# Patient Record
Sex: Male | Born: 1960 | Race: White | Hispanic: No | Marital: Married | Smoking: Current every day smoker
Health system: Southern US, Community
[De-identification: ages and names within clinical notes are randomized; demographics above are authoritative.]

## PROBLEM LIST (undated history)

## (undated) DIAGNOSIS — N200 Calculus of kidney: Secondary | ICD-10-CM

## (undated) DIAGNOSIS — M5430 Sciatica, unspecified side: Secondary | ICD-10-CM

## (undated) DIAGNOSIS — I10 Essential (primary) hypertension: Secondary | ICD-10-CM

## (undated) DIAGNOSIS — C801 Malignant (primary) neoplasm, unspecified: Secondary | ICD-10-CM

## (undated) DIAGNOSIS — E7800 Pure hypercholesterolemia, unspecified: Secondary | ICD-10-CM

## (undated) DIAGNOSIS — M549 Dorsalgia, unspecified: Secondary | ICD-10-CM

## (undated) HISTORY — PX: SKIN CANCER EXCISION: SHX779

---

## 2004-04-07 ENCOUNTER — Emergency Department (HOSPITAL_COMMUNITY): Admission: EM | Admit: 2004-04-07 | Discharge: 2004-04-07 | Payer: Self-pay | Admitting: *Deleted

## 2004-04-18 ENCOUNTER — Encounter (HOSPITAL_COMMUNITY): Admission: RE | Admit: 2004-04-18 | Discharge: 2004-05-18 | Payer: Self-pay | Admitting: Family Medicine

## 2004-05-15 ENCOUNTER — Ambulatory Visit (HOSPITAL_COMMUNITY): Admission: RE | Admit: 2004-05-15 | Discharge: 2004-05-15 | Payer: Self-pay | Admitting: Orthopaedic Surgery

## 2006-01-03 ENCOUNTER — Emergency Department (HOSPITAL_COMMUNITY): Admission: EM | Admit: 2006-01-03 | Discharge: 2006-01-03 | Payer: Self-pay | Admitting: Emergency Medicine

## 2007-01-11 ENCOUNTER — Emergency Department (HOSPITAL_COMMUNITY): Admission: EM | Admit: 2007-01-11 | Discharge: 2007-01-11 | Payer: Self-pay | Admitting: Emergency Medicine

## 2008-04-02 ENCOUNTER — Emergency Department (HOSPITAL_COMMUNITY): Admission: EM | Admit: 2008-04-02 | Discharge: 2008-04-02 | Payer: Self-pay | Admitting: Emergency Medicine

## 2008-04-04 ENCOUNTER — Emergency Department (HOSPITAL_COMMUNITY): Admission: EM | Admit: 2008-04-04 | Discharge: 2008-04-04 | Payer: Self-pay | Admitting: Emergency Medicine

## 2008-04-06 ENCOUNTER — Emergency Department (HOSPITAL_COMMUNITY): Admission: EM | Admit: 2008-04-06 | Discharge: 2008-04-06 | Payer: Self-pay | Admitting: Emergency Medicine

## 2008-04-08 ENCOUNTER — Emergency Department (HOSPITAL_COMMUNITY): Admission: EM | Admit: 2008-04-08 | Discharge: 2008-04-08 | Payer: Self-pay | Admitting: Emergency Medicine

## 2011-04-19 ENCOUNTER — Emergency Department (HOSPITAL_COMMUNITY)
Admission: EM | Admit: 2011-04-19 | Discharge: 2011-04-19 | Disposition: A | Discharge status: Home / Self Care | Payer: 59 | Attending: Emergency Medicine | Admitting: Emergency Medicine

## 2011-04-19 DIAGNOSIS — L0231 Cutaneous abscess of buttock: Secondary | ICD-10-CM | POA: Insufficient documentation

## 2011-04-21 ENCOUNTER — Emergency Department (HOSPITAL_COMMUNITY)
Admission: EM | Admit: 2011-04-21 | Discharge: 2011-04-21 | Disposition: A | Discharge status: Home / Self Care | Payer: 59 | Attending: Emergency Medicine | Admitting: Emergency Medicine

## 2011-04-21 DIAGNOSIS — L03317 Cellulitis of buttock: Secondary | ICD-10-CM | POA: Insufficient documentation

## 2011-04-21 DIAGNOSIS — L0231 Cutaneous abscess of buttock: Secondary | ICD-10-CM | POA: Insufficient documentation

## 2011-09-02 ENCOUNTER — Emergency Department (HOSPITAL_COMMUNITY)
Admission: EM | Admit: 2011-09-02 | Discharge: 2011-09-02 | Disposition: A | Discharge status: Home / Self Care | Payer: Self-pay | Attending: Emergency Medicine | Admitting: Emergency Medicine

## 2011-09-02 ENCOUNTER — Encounter: Payer: Self-pay | Admitting: Emergency Medicine

## 2011-09-02 DIAGNOSIS — F17200 Nicotine dependence, unspecified, uncomplicated: Secondary | ICD-10-CM | POA: Insufficient documentation

## 2011-09-02 DIAGNOSIS — L0231 Cutaneous abscess of buttock: Secondary | ICD-10-CM

## 2011-09-02 MED ORDER — LIDOCAINE HCL (PF) 1 % IJ SOLN
5.0000 mL | Freq: Once | INTRAMUSCULAR | Status: AC
  Administered 2011-09-02: 5 mL
  Filled 2011-09-02: qty 5

## 2011-09-02 MED ORDER — OXYCODONE-ACETAMINOPHEN 5-325 MG PO TABS
1.0000 | ORAL_TABLET | Freq: Once | ORAL | Status: AC
  Administered 2011-09-02: 1 via ORAL
  Filled 2011-09-02: qty 1

## 2011-09-02 MED ORDER — HYDROCODONE-ACETAMINOPHEN 5-325 MG PO TABS: ORAL_TABLET | ORAL | Status: AC

## 2011-09-02 MED ORDER — DOXYCYCLINE HYCLATE 100 MG PO TABS
100.0000 mg | ORAL_TABLET | Freq: Once | ORAL | Status: AC
  Administered 2011-09-02: 100 mg via ORAL
  Filled 2011-09-02: qty 1

## 2011-09-02 MED ORDER — DOXYCYCLINE HYCLATE 100 MG PO CAPS: 100.0000 mg | ORAL_CAPSULE | Freq: Two times a day (BID) | ORAL | Status: AC

## 2011-09-02 NOTE — ED Provider Notes (Signed)
History     CSN: [id number] Arrival date & time: 09/02/2011  6:24 PM   Chief Complaint  Patient presents with  . Abscess     (Include location/radiation/quality/duration/timing/severity/associated sxs/prior treatment) Patient is a 50 y.o. male presenting with abscess. The history is provided by the patient.  Abscess  This is a new problem. The current episode started yesterday. The onset was gradual. The problem occurs continuously. The problem has been gradually worsening. The abscess is present on the left buttock. The problem is moderate. The abscess is characterized by painfulness and swelling. Pertinent negatives include no fever and no vomiting. His past medical history is significant for skin abscesses in family. There were no sick contacts. He has received no recent medical care.     History reviewed. No pertinent past medical history.   History reviewed. No pertinent past surgical history.  History reviewed. No pertinent family history.  History  Substance Use Topics  . Smoking status: Current Everyday Smoker    Types: Cigarettes  . Smokeless tobacco: Not on file  . Alcohol Use: No      Review of Systems  Constitutional: Negative for fever.  Gastrointestinal: Negative for vomiting.  Genitourinary: Negative for dysuria, hematuria and decreased urine volume.  Musculoskeletal: Negative for back pain and arthralgias.  Skin:       abscess  Neurological: Negative for weakness and numbness.  Hematological: Negative for adenopathy. Does not bruise/bleed easily.  All other systems reviewed and are negative.    Allergies  Bee venom  Home Medications  No current outpatient prescriptions on file.  Physical Exam    BP 150/106  Pulse 110  Temp(Src) 98 F (36.7 C) (Oral)  Resp 18  Ht 5\' 4"  (1.626 m)  Wt 154 lb 9.6 oz (70.126 kg)  BMI 26.54 kg/m2  SpO2 98%  Physical Exam  Nursing note and vitals reviewed. Constitutional: He appears well-developed and  well-nourished. No distress.  HENT:  Head: Normocephalic and atraumatic.  Mouth/Throat: Oropharynx is clear and moist.  Neck: Normal range of motion. Neck supple.  Cardiovascular: Normal rate, regular rhythm and normal heart sounds.   Pulmonary/Chest: Effort normal and breath sounds normal.  Musculoskeletal: He exhibits no edema and no tenderness.  Lymphadenopathy:    He has no cervical adenopathy.  Neurological: He is alert. No cranial nerve deficit. Coordination normal.  Skin:          Fluctuant abscess to the upper left buttock.  Moderate induration.  No drainage or surrounding erythema    ED Course  INCISION AND DRAINAGE Date/Time: 09/02/2011 9:15 PM Performed by: Trisha Mangle, Inna Tisdell L. Authorized by: Linwood Dibbles R Consent: Verbal consent obtained. Written consent not obtained. Risks and benefits: risks, benefits and alternatives were discussed Consent given by: patient Patient understanding: patient states understanding of the procedure being performed Patient consent: the patient's understanding of the procedure matches consent given Procedure consent: procedure consent matches procedure scheduled Patient identity confirmed: verbally with patient Time out: Immediately prior to procedure a "time out" was called to verify the correct patient, procedure, equipment, support staff and site/side marked as required. Type: abscess Location: left buttock. Anesthesia: local infiltration Local anesthetic: lidocaine 1% without epinephrine Anesthetic total: 3 ml Patient sedated: no Scalpel size: 11 Needle gauge: 22 Incision type: single straight Complexity: complex Drainage: purulent Drainage amount: copious Wound treatment: wound left open Packing material: 1/4 in iodoform gauze Patient tolerance: Patient tolerated the procedure well with no immediate complications.       MDM  Abscess to left upper buttock.  Pt has hx of same.  Will start doxycycline.  Agrees to return here  in 2 days for recheck and packing removal. No surrounding erythema at this time.         Simi Briel L. [place], [place] 09/02/11 2132

## 2011-09-02 NOTE — ED Notes (Signed)
Pt c/o abscess to the left buttocks x2 days. 

## 2011-09-02 NOTE — ED Notes (Signed)
50 cent piece sized abscess to left upper buttock; states has been there x 1 week; states has not been draining.

## 2011-09-02 NOTE — ED Notes (Signed)
MD at bedside. 

## 2011-09-03 NOTE — ED Provider Notes (Signed)
Medical screening examination/treatment/procedure(s) were performed by non-physician practitioner and as supervising physician I was immediately available for consultation/collaboration.  Celene Kras, MD 09/03/11 1600

## 2011-09-04 ENCOUNTER — Encounter (HOSPITAL_COMMUNITY): Payer: Self-pay | Admitting: *Deleted

## 2011-09-04 ENCOUNTER — Emergency Department (HOSPITAL_COMMUNITY)
Admission: EM | Admit: 2011-09-04 | Discharge: 2011-09-04 | Disposition: A | Discharge status: Home / Self Care | Payer: Self-pay | Attending: Emergency Medicine | Admitting: Emergency Medicine

## 2011-09-04 DIAGNOSIS — L0231 Cutaneous abscess of buttock: Secondary | ICD-10-CM | POA: Insufficient documentation

## 2011-09-04 DIAGNOSIS — Z4801 Encounter for change or removal of surgical wound dressing: Secondary | ICD-10-CM | POA: Insufficient documentation

## 2011-09-04 DIAGNOSIS — L03317 Cellulitis of buttock: Secondary | ICD-10-CM | POA: Insufficient documentation

## 2011-09-04 NOTE — ED Notes (Addendum)
Pt states he is here to have packing removed from a wound on his bottom.

## 2011-09-04 NOTE — ED Provider Notes (Signed)
History     CSN: [id number] Arrival date & time: 09/04/2011  5:29 PM  Chief Complaint  Patient presents with  . Wound Check    HPI  (Consider location/radiation/quality/duration/timing/severity/associated sxs/prior treatment)  Patient is a 50 y.o. male presenting with wound check. The history is provided by the patient.  Wound Check  He was treated in the ED 2 to 3 days ago. Previous treatment in the ED includes I&D of abscess. Treatments since wound repair include antibiotic ointment use. Fever duration: no fever. There has been colored discharge from the wound. The redness has improved. The swelling has improved. The pain has improved. He has no difficulty moving the affected extremity or digit.    History reviewed. No pertinent past medical history.  History reviewed. No pertinent past surgical history.  History reviewed. No pertinent family history.  History  Substance Use Topics  . Smoking status: Current Everyday Smoker    Types: Cigarettes  . Smokeless tobacco: Not on file  . Alcohol Use: No      Review of Systems  Review of Systems  Constitutional: Negative for fever.  Gastrointestinal: Negative for vomiting, abdominal pain and diarrhea.  Musculoskeletal: Negative for back pain and gait problem.  Skin: Positive for wound.  Neurological: Negative for weakness and numbness.  Hematological: Negative for adenopathy. Does not bruise/bleed easily.  All other systems reviewed and are negative.    Allergies  Bee venom  Home Medications   Current Outpatient Rx  Name Route Sig Dispense Refill  . DOXYCYCLINE HYCLATE 100 MG PO CAPS Oral Take 1 capsule (100 mg total) by mouth 2 (two) times daily. 20 capsule 0  . HYDROCODONE-ACETAMINOPHEN 5-325 MG PO TABS  Take one-two tabs po q 4-6 hrs prn pain 24 tablet 0    Physical Exam    BP 136/92  Pulse 87  Temp(Src) 98.4 F (36.9 C) (Oral)  Resp 20  Ht 5\' 4"  (1.626 m)  Wt 154 lb (69.854 kg)  BMI 26.43 kg/m2  SpO2  99%  Physical Exam  Nursing note and vitals reviewed. Constitutional: He is oriented to person, place, and time. He appears well-developed and well-nourished. No distress.  HENT:  Head: Normocephalic and atraumatic.  Mouth/Throat: Oropharynx is clear and moist.  Cardiovascular: Normal rate, regular rhythm and normal heart sounds.   Pulmonary/Chest: Effort normal and breath sounds normal.  Abdominal: Soft. He exhibits no distension. There is no tenderness. There is no rebound.  Musculoskeletal: He exhibits tenderness. He exhibits no edema.  Neurological: He is oriented to person, place, and time. He exhibits normal muscle tone. Coordination normal.  Skin: Skin is warm. No rash noted.       Abscess to left buttock with previous I&D performed.      ED Course  Procedures (including critical care time)       MDM     6:14 PM abscess to left buttock with previous I&D performed by me.  Appears to be improving.  Packing removed from by me without difficulty.  Patient currently taking antibiotics    Ellice Boultinghouse L. [place], [place] 09/13/11 1825

## 2011-09-09 LAB — DIFFERENTIAL
Basophils Absolute: 0
Basophils Relative: 0
Eosinophils Absolute: 0.1
Eosinophils Relative: 1
Lymphocytes Relative: 14
Lymphs Abs: 2
Monocytes Absolute: 0.6
Monocytes Relative: 4
Neutro Abs: 11.1 — ABNORMAL HIGH
Neutrophils Relative %: 81 — ABNORMAL HIGH

## 2011-09-09 LAB — CBC
HCT: 44.3
Hemoglobin: 15.8
MCHC: 35.6
MCV: 90.3
Platelets: 287
RBC: 4.91
RDW: 13.8
WBC: 13.8 — ABNORMAL HIGH

## 2011-09-16 NOTE — ED Provider Notes (Signed)
Medical screening examination/treatment/procedure(s) were performed by non-physician practitioner and as supervising physician I was immediately available for consultation/collaboration.   Shelda Jakes, MD 09/16/11 [phone]

## 2012-01-31 ENCOUNTER — Emergency Department (HOSPITAL_COMMUNITY)
Admission: EM | Admit: 2012-01-31 | Discharge: 2012-01-31 | Disposition: A | Discharge status: Home / Self Care | Payer: Self-pay | Attending: Emergency Medicine | Admitting: Emergency Medicine

## 2012-01-31 ENCOUNTER — Encounter (HOSPITAL_COMMUNITY): Payer: Self-pay | Admitting: *Deleted

## 2012-01-31 DIAGNOSIS — M549 Dorsalgia, unspecified: Secondary | ICD-10-CM | POA: Insufficient documentation

## 2012-01-31 DIAGNOSIS — M5430 Sciatica, unspecified side: Secondary | ICD-10-CM | POA: Insufficient documentation

## 2012-01-31 DIAGNOSIS — F17200 Nicotine dependence, unspecified, uncomplicated: Secondary | ICD-10-CM | POA: Insufficient documentation

## 2012-01-31 MED ORDER — METHOCARBAMOL 500 MG PO TABS: ORAL_TABLET | ORAL | Status: DC

## 2012-01-31 MED ORDER — NAPROXEN 500 MG PO TABS: 500.0000 mg | ORAL_TABLET | Freq: Two times a day (BID) | ORAL | Status: DC

## 2012-01-31 MED ORDER — OXYCODONE-ACETAMINOPHEN 5-325 MG PO TABS: 1.0000 | ORAL_TABLET | ORAL | Status: AC | PRN

## 2012-01-31 MED ORDER — METHOCARBAMOL 500 MG PO TABS
1000.0000 mg | ORAL_TABLET | Freq: Once | ORAL | Status: AC
  Administered 2012-01-31: 1000 mg via ORAL
  Filled 2012-01-31: qty 2

## 2012-01-31 MED ORDER — OXYCODONE-ACETAMINOPHEN 5-325 MG PO TABS
1.0000 | ORAL_TABLET | Freq: Once | ORAL | Status: AC
  Administered 2012-01-31: 1 via ORAL
  Filled 2012-01-31: qty 1

## 2012-01-31 NOTE — ED Notes (Signed)
Pt c/o pain in his lower back x 2 months. Pt states that the pain is radiating down to his right buttock and leg x 1- 2 weeks ago. Pt denies injury.

## 2012-01-31 NOTE — ED Notes (Addendum)
Pt a/ox4. Resp even and unlabored. NAD at this time. D/C instructions reviewed with pt. Pt verbalized understanding. Pt ambulated to lobby with steady gate.  

## 2012-01-31 NOTE — Discharge Instructions (Signed)
Sciatica Sciatica is a name for lower back pain caused by pressure on the sciatic nerve. The back pain can spread to the buttocks and back of the leg. HOME CARE   Rest as much as possible.   Only take medicine as told by your doctor.   Apply cold or heat to your back as told by your doctor.   Do not bend, lift, or sit for a long time until your pain is better.   Do not do anything that makes the condition worse.   Start normal activity when the pain is better.   Keep all follow-up visits.  GET HELP RIGHT AWAY IF:   There is more pain.   There is weakness or numbness in the legs.   You cannot control when you poop (bowel movement) or pee (urinate).  MAKE SURE YOU:   Understand these instructions.   Will watch this condition.   Will get help right away if you are not doing well or get worse.  Document Released: 09/09/2008 Document Revised: 08/13/2011 Document Reviewed: 09/09/2008 [facility] Patient Information 2012 [place], [place].

## 2012-01-31 NOTE — ED Provider Notes (Signed)
History     CSN: [id number]  Arrival date & time 01/31/12  1445   First MD Initiated Contact with Patient 01/31/12 1516      Chief Complaint  Patient presents with  . Back Pain    (Consider location/radiation/quality/duration/timing/severity/associated sxs/prior treatment) HPI Comments: Patient with hx of chronic lower back pain c/o worsening pain for 2 months.  States that recently the pain has began to radiate into his right leg down to his foot.  Pain is worse with certain movements and improves with rest.  Stats he has been taking ibuprofen w/o improvement of his pain.  States the pain is similar to previous lower back pain but the pain into his leg is new.  He denies known injury, dysuria, incontinence of urine or feces, weakness of the extremities or abd pain  Patient is a 51 y.o. male presenting with back pain. The history is provided by the patient. No language interpreter was used.  Back Pain  This is a chronic problem. The current episode started more than 1 week ago. The problem occurs constantly. The problem has been gradually worsening. The pain is associated with twisting. The pain is present in the sacro-iliac joint and lumbar spine. The quality of the pain is described as aching and shooting. The pain radiates to the right thigh and right foot. The pain is moderate. The symptoms are aggravated by bending, twisting and certain positions. The pain is the same all the time. Associated symptoms include leg pain. Pertinent negatives include no fever, no numbness, no abdominal pain, no abdominal swelling, no bowel incontinence, no perianal numbness, no bladder incontinence, no dysuria, no pelvic pain, no paresthesias, no paresis, no tingling and no weakness. He has tried NSAIDs for the symptoms. The treatment provided no relief.    History reviewed. No pertinent past medical history.  History reviewed. No pertinent past surgical history.  History reviewed. No pertinent family  history.  History  Substance Use Topics  . Smoking status: Current Everyday Smoker    Types: Cigarettes  . Smokeless tobacco: Not on file  . Alcohol Use: No      Review of Systems  Constitutional: Negative for fever.  Gastrointestinal: Negative for abdominal pain and bowel incontinence.  Genitourinary: Negative for bladder incontinence, dysuria, decreased urine volume, testicular pain and pelvic pain.  Musculoskeletal: Positive for back pain. Negative for joint swelling.  Skin: Negative.   Neurological: Negative for tingling, weakness, numbness and paresthesias.  All other systems reviewed and are negative.    Allergies  Bee venom  Home Medications   Current Outpatient Rx  Name Route Sig Dispense Refill  . IBUPROFEN 200 MG PO TABS Oral Take 800 mg by mouth every 6 (six) hours as needed. pain      BP 142/92  Pulse 109  Temp(Src) 97.8 F (36.6 C) (Oral)  Resp 20  Ht 5\' 4"  (1.626 m)  Wt 145 lb (65.772 kg)  BMI 24.89 kg/m2  SpO2 98%  Physical Exam  Nursing note and vitals reviewed. Constitutional: He is oriented to person, place, and time. He appears well-developed and well-nourished. No distress.  HENT:  Head: Normocephalic and atraumatic.  Mouth/Throat: Oropharynx is clear and moist.  Neck: Normal range of motion. Neck supple.  Cardiovascular: Normal rate, regular rhythm and normal heart sounds.   Pulmonary/Chest: Effort normal and breath sounds normal. No respiratory distress.  Abdominal: Soft. He exhibits no distension. There is no tenderness.  Musculoskeletal: He exhibits no edema and no tenderness.  Lumbar back: He exhibits tenderness, bony tenderness and pain. He exhibits normal range of motion, no swelling, no edema and normal pulse.  Lymphadenopathy:    He has no cervical adenopathy.  Neurological: He is alert and oriented to person, place, and time. No cranial nerve deficit or sensory deficit. He exhibits normal muscle tone. Coordination and gait  normal.  Reflex Scores:      Patellar reflexes are 2+ on the right side and 2+ on the left side.      Achilles reflexes are 2+ on the right side and 2+ on the left side. Skin: Skin is warm and dry.    ED Course  Procedures (including critical care time)       MDM     ttp of the right SI joint and paraspinal muscles.  No focal neuro deficits, ambulates with a slow but steady gait. pt has appt with his PMD this week.  He agrees to d/c the ibuprofen while taking the naprosyn  Patient / Family / Caregiver understand and agree with initial ED impression and plan with expectations set for ED visit. Pt stable in ED with no significant deterioration in condition.       Annise Boran L. [place], [place] 02/01/12 2008

## 2012-02-02 NOTE — ED Provider Notes (Signed)
Medical screening examination/treatment/procedure(s) were performed by non-physician practitioner and as supervising physician I was immediately available for consultation/collaboration.   Shelda Jakes, MD 02/02/12 1126

## 2012-06-19 ENCOUNTER — Emergency Department (HOSPITAL_COMMUNITY): Payer: Self-pay

## 2012-06-19 ENCOUNTER — Encounter (HOSPITAL_COMMUNITY): Payer: Self-pay | Admitting: *Deleted

## 2012-06-19 ENCOUNTER — Emergency Department (HOSPITAL_COMMUNITY)
Admission: EM | Admit: 2012-06-19 | Discharge: 2012-06-19 | Disposition: A | Discharge status: Home / Self Care | Payer: Self-pay | Attending: Emergency Medicine | Admitting: Emergency Medicine

## 2012-06-19 DIAGNOSIS — N201 Calculus of ureter: Secondary | ICD-10-CM | POA: Insufficient documentation

## 2012-06-19 DIAGNOSIS — N509 Disorder of male genital organs, unspecified: Secondary | ICD-10-CM | POA: Insufficient documentation

## 2012-06-19 DIAGNOSIS — R109 Unspecified abdominal pain: Secondary | ICD-10-CM | POA: Insufficient documentation

## 2012-06-19 DIAGNOSIS — Z7982 Long term (current) use of aspirin: Secondary | ICD-10-CM | POA: Insufficient documentation

## 2012-06-19 DIAGNOSIS — Z79899 Other long term (current) drug therapy: Secondary | ICD-10-CM | POA: Insufficient documentation

## 2012-06-19 DIAGNOSIS — N200 Calculus of kidney: Secondary | ICD-10-CM | POA: Insufficient documentation

## 2012-06-19 LAB — URINE MICROSCOPIC-ADD ON

## 2012-06-19 LAB — URINALYSIS, ROUTINE W REFLEX MICROSCOPIC
Bilirubin Urine: NEGATIVE
Glucose, UA: NEGATIVE mg/dL
Ketones, ur: NEGATIVE mg/dL
Leukocytes, UA: NEGATIVE
Protein, ur: 30 mg/dL — AB
Urobilinogen, UA: 0.2 mg/dL (ref 0.0–1.0)
pH: 6 (ref 5.0–8.0)

## 2012-06-19 LAB — CBC WITH DIFFERENTIAL/PLATELET
Eosinophils Absolute: 0.2 10*3/uL (ref 0.0–0.7)
HCT: 50.6 % (ref 39.0–52.0)
MCH: 33 pg (ref 26.0–34.0)
MCHC: 36 g/dL (ref 30.0–36.0)
RDW: 13.8 % (ref 11.5–15.5)
WBC: 15.1 10*3/uL — ABNORMAL HIGH (ref 4.0–10.5)

## 2012-06-19 LAB — BASIC METABOLIC PANEL
CO2: 26 mEq/L (ref 19–32)
Calcium: 10.6 mg/dL — ABNORMAL HIGH (ref 8.4–10.5)
Creatinine, Ser: 1 mg/dL (ref 0.50–1.35)
GFR calc non Af Amer: 87 mL/min — ABNORMAL LOW (ref >90)
Potassium: 4.5 mEq/L (ref 3.5–5.1)

## 2012-06-19 MED ORDER — ONDANSETRON HCL 4 MG/2ML IJ SOLN
4.0000 mg | Freq: Once | INTRAMUSCULAR | Status: AC
  Administered 2012-06-19: 4 mg via INTRAVENOUS
  Filled 2012-06-19: qty 2

## 2012-06-19 MED ORDER — OXYCODONE-ACETAMINOPHEN 5-325 MG PO TABS: 2.0000 | ORAL_TABLET | ORAL | Status: AC | PRN

## 2012-06-19 MED ORDER — KETOROLAC TROMETHAMINE 30 MG/ML IJ SOLN
30.0000 mg | Freq: Once | INTRAMUSCULAR | Status: AC
  Administered 2012-06-19: 30 mg via INTRAVENOUS
  Filled 2012-06-19: qty 1

## 2012-06-19 MED ORDER — TAMSULOSIN HCL 0.4 MG PO CAPS: 0.4000 mg | ORAL_CAPSULE | Freq: Every day | ORAL | Status: DC

## 2012-06-19 MED ORDER — IBUPROFEN 800 MG PO TABS: 800.0000 mg | ORAL_TABLET | Freq: Three times a day (TID) | ORAL | Status: AC

## 2012-06-19 MED ORDER — ONDANSETRON HCL 4 MG PO TABS: 4.0000 mg | ORAL_TABLET | Freq: Four times a day (QID) | ORAL | Status: AC

## 2012-06-19 MED ORDER — SODIUM CHLORIDE 0.9 % IV BOLUS (SEPSIS)
1000.0000 mL | Freq: Once | INTRAVENOUS | Status: AC
  Administered 2012-06-19: 1000 mL via INTRAVENOUS
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
## 2012-06-19 NOTE — ED Notes (Signed)
Pt just returned from ct. Nad.

## 2012-06-19 NOTE — ED Notes (Signed)
Left flank pain and left testicular pain x 1 week. Pain became constant yesterday.

## 2012-06-19 NOTE — ED Notes (Signed)
Ambulated pt ; pt states he feels fine and without pain

## 2012-06-19 NOTE — ED Notes (Signed)
Gave pt a coke as fluid challenge;

## 2012-07-04 ENCOUNTER — Emergency Department (HOSPITAL_COMMUNITY): Payer: 59

## 2012-07-04 ENCOUNTER — Encounter (HOSPITAL_COMMUNITY): Payer: Self-pay | Admitting: Emergency Medicine

## 2012-07-04 ENCOUNTER — Inpatient Hospital Stay (HOSPITAL_COMMUNITY)
Admission: EM | Admit: 2012-07-04 | Discharge: 2012-07-06 | DRG: 194 | Disposition: A | Discharge status: Home / Self Care | Payer: 59 | Attending: Internal Medicine | Admitting: Internal Medicine

## 2012-07-04 DIAGNOSIS — K047 Periapical abscess without sinus: Secondary | ICD-10-CM

## 2012-07-04 DIAGNOSIS — Z79899 Other long term (current) drug therapy: Secondary | ICD-10-CM

## 2012-07-04 DIAGNOSIS — F17200 Nicotine dependence, unspecified, uncomplicated: Secondary | ICD-10-CM | POA: Diagnosis present

## 2012-07-04 DIAGNOSIS — N189 Chronic kidney disease, unspecified: Secondary | ICD-10-CM | POA: Diagnosis present

## 2012-07-04 DIAGNOSIS — E876 Hypokalemia: Secondary | ICD-10-CM | POA: Diagnosis present

## 2012-07-04 DIAGNOSIS — R112 Nausea with vomiting, unspecified: Secondary | ICD-10-CM | POA: Diagnosis present

## 2012-07-04 DIAGNOSIS — I129 Hypertensive chronic kidney disease with stage 1 through stage 4 chronic kidney disease, or unspecified chronic kidney disease: Secondary | ICD-10-CM | POA: Diagnosis present

## 2012-07-04 DIAGNOSIS — I10 Essential (primary) hypertension: Secondary | ICD-10-CM | POA: Diagnosis present

## 2012-07-04 DIAGNOSIS — J189 Pneumonia, unspecified organism: Principal | ICD-10-CM | POA: Diagnosis present

## 2012-07-04 DIAGNOSIS — Z720 Tobacco use: Secondary | ICD-10-CM | POA: Diagnosis present

## 2012-07-04 DIAGNOSIS — E871 Hypo-osmolality and hyponatremia: Secondary | ICD-10-CM | POA: Diagnosis present

## 2012-07-04 DIAGNOSIS — Z87442 Personal history of urinary calculi: Secondary | ICD-10-CM | POA: Diagnosis present

## 2012-07-04 DIAGNOSIS — E785 Hyperlipidemia, unspecified: Secondary | ICD-10-CM | POA: Diagnosis present

## 2012-07-04 DIAGNOSIS — Z7982 Long term (current) use of aspirin: Secondary | ICD-10-CM

## 2012-07-04 HISTORY — DX: Essential (primary) hypertension: I10

## 2012-07-04 HISTORY — DX: Pure hypercholesterolemia, unspecified: E78.00

## 2012-07-04 LAB — URINALYSIS, ROUTINE W REFLEX MICROSCOPIC
Glucose, UA: NEGATIVE mg/dL
Leukocytes, UA: NEGATIVE
Protein, ur: 100 mg/dL — AB
pH: 6 (ref 5.0–8.0)

## 2012-07-04 LAB — CBC WITH DIFFERENTIAL/PLATELET
Basophils Absolute: 0 10*3/uL (ref 0.0–0.1)
Lymphocytes Relative: 6 % — ABNORMAL LOW (ref 12–46)
Neutro Abs: 16.6 10*3/uL — ABNORMAL HIGH (ref 1.7–7.7)
Platelets: 237 10*3/uL (ref 150–400)
RDW: 13.6 % (ref 11.5–15.5)
WBC: 18.7 10*3/uL — ABNORMAL HIGH (ref 4.0–10.5)

## 2012-07-04 LAB — BASIC METABOLIC PANEL
CO2: 23 mEq/L (ref 19–32)
Chloride: 95 mEq/L — ABNORMAL LOW (ref 96–112)
Potassium: 2.7 mEq/L — CL (ref 3.5–5.1)
Sodium: 130 mEq/L — ABNORMAL LOW (ref 135–145)

## 2012-07-04 LAB — URINE MICROSCOPIC-ADD ON

## 2012-07-04 MED ORDER — MORPHINE SULFATE 2 MG/ML IJ SOLN
INTRAMUSCULAR | Status: AC
  Administered 2012-07-04: 2 mg via INTRAVENOUS
  Filled 2012-07-04: qty 1

## 2012-07-04 MED ORDER — NICOTINE 14 MG/24HR TD PT24
MEDICATED_PATCH | TRANSDERMAL | Status: AC
  Administered 2012-07-04: 23:00:00
  Filled 2012-07-04: qty 1

## 2012-07-04 MED ORDER — ACETAMINOPHEN 325 MG PO TABS
ORAL_TABLET | ORAL | Status: AC
  Administered 2012-07-04: 23:00:00
  Filled 2012-07-04: qty 2

## 2012-07-04 MED ORDER — ACETAMINOPHEN 325 MG PO TABS
ORAL_TABLET | ORAL | Status: AC
  Administered 2012-07-04: 975 mg
  Filled 2012-07-04: qty 3

## 2012-07-04 MED ORDER — OXYCODONE-ACETAMINOPHEN 5-325 MG PO TABS
1.0000 | ORAL_TABLET | Freq: Once | ORAL | Status: AC
  Administered 2012-07-04: 1 via ORAL
  Filled 2012-07-04: qty 1

## 2012-07-04 MED ORDER — POTASSIUM CHLORIDE CRYS ER 20 MEQ PO TBCR
EXTENDED_RELEASE_TABLET | ORAL | Status: AC
  Administered 2012-07-04: 40 meq
  Filled 2012-07-04: qty 2

## 2012-07-04 MED ORDER — DEXTROSE 5 % IV SOLN
INTRAVENOUS | Status: AC
  Administered 2012-07-04: 500 mg
  Filled 2012-07-04: qty 500

## 2012-07-04 MED ORDER — ONDANSETRON HCL 4 MG PO TABS
ORAL_TABLET | ORAL | Status: AC
  Administered 2012-07-04: 4 mg
  Filled 2012-07-04: qty 1

## 2012-07-04 MED ORDER — DEXTROSE 5 % IV SOLN
INTRAVENOUS | Status: AC
  Administered 2012-07-04: 1000 mg
  Filled 2012-07-04: qty 10

## 2012-07-04 NOTE — ED Notes (Signed)
Pt c/o ha/fever/n/v/d since Friday.

## 2012-07-04 NOTE — ED Notes (Signed)
CRITICAL VALUE ALERT  Critical value received:  Potassium 2.7  Date of notification:  07/04/2012  Time of notification: 1540  Critical value read back:yes Nurse who received alert: Garrison Columbus, RN  MD notified (1st page):  Dr. Ignacia Palma  Time of first page: 1540  MD notified (2nd page):  Time of second page:  Responding MD: Dr. Ignacia Palma  Time MD responded:  1540

## 2012-07-04 NOTE — ED Notes (Signed)
Pt medicated for pain. Pt states headache began on Friday. No neuro deficits noted. Upon exam by EMD, abscessed tooth noted witch patient said had been painful,

## 2012-07-04 NOTE — ED Provider Notes (Signed)
History  This chart was scribed for Glen Cooper III, MD by Bennett Scrape. This patient was seen in room APA08/[facility]08 and the patient's care was started at 2:33PM.  CSN: [id number]  Arrival date & time 07/04/12  1339   First MD Initiated Contact with Patient 07/04/12 1433      Chief Complaint  Patient presents with  . Headache  . Fever  . Emesis     Patient is a 51 y.o. male presenting with headaches. The history is provided by the patient. No language interpreter was used.  Headache  Associated symptoms include a fever, nausea and vomiting. Pertinent negatives include no syncope.     Glen Livingston is a 51 y.o. male who presents to the Emergency Department complaining of 2 days of gradual onset, constant, diffuse HA with associated fever, nausea, emesis, diarrhea, right otalgia and myalgias. Temperature is 99 in the ED. He denies having any modifying factors. He reports taking ibuprofen, tylenol and motrin with no improvement. He also c/o right bottom posterior dental pain and states that he passed 2 kidney stones 3 days ago. He denies having a h/o frequent kidney stones. He denies neck pain, sore throat, visual disturbance, CP, cough, SOB, abdominal pain, urinary symptoms, back pain, weakness, numbness and rash as associated symptoms. He has a h/o HLD, HTN and renal disorder. He is a current everyday smoker but denies alcohol use.  PCP is Dr. Regino Schultze.   Past Medical History  Diagnosis Date  . Hypertension   . High cholesterol   . Renal disorder     Past Surgical History  Procedure Date  . Skin cancer excision-left nare     No family history on file.  History  Substance Use Topics  . Smoking status: Current Everyday Smoker    Types: Cigarettes  . Smokeless tobacco: Not on file  . Alcohol Use: No      Review of Systems  Constitutional: Positive for fever.  HENT: Positive for ear pain and dental problem.   Eyes: Negative for visual disturbance.  Cardiovascular:  Negative for chest pain and syncope.  Gastrointestinal: Positive for nausea and vomiting.  Musculoskeletal: Negative for back pain.  Neurological: Negative for seizures and syncope.    Allergies  Bee venom  Home Medications   Current Outpatient Rx  Name Route Sig Dispense Refill  . ASPIRIN EC 81 MG PO TBEC Oral Take 81 mg by mouth daily.    . IBUPROFEN 200 MG PO TABS Oral Take 800 mg by mouth every 6 (six) hours as needed. pain    . SIMVASTATIN 10 MG PO TABS Oral Take 10 mg by mouth at bedtime.    . TAMSULOSIN HCL 0.4 MG PO CAPS Oral Take 1 capsule (0.4 mg total) by mouth daily. 10 capsule 0    Triage Vitals: BP 132/81  Pulse 102  Temp 99 F (37.2 C)  Resp 18  Ht 5\' 4"  (1.626 m)  Wt 160 lb (72.576 kg)  BMI 27.46 kg/m2  SpO2 94%  Physical Exam  Nursing note and vitals reviewed. Constitutional: He is oriented to person, place, and time. He appears well-developed and well-nourished. No distress.  HENT:  Head: Normocephalic and atraumatic.  Mouth/Throat: Oropharynx is clear and moist.       TMs are normal bilaterally, swelling along right lower 2nd molar   Eyes: Conjunctivae and EOM are normal. Pupils are equal, round, and reactive to light.  Neck: Neck supple. No tracheal deviation present.  Cardiovascular: Normal rate, regular  rhythm and normal heart sounds.   Pulmonary/Chest: Effort normal and breath sounds normal. No respiratory distress.  Abdominal: Soft. There is no tenderness.  Musculoskeletal: Normal range of motion.  Neurological: He is alert and oriented to person, place, and time.  Skin: Skin is warm and dry.  Psychiatric: He has a normal mood and affect. His behavior is normal.    ED Course  Procedures (including critical care time)  DIAGNOSTIC STUDIES: Oxygen Saturation is 94% on room air, adequate by my interpretation.    COORDINATION OF CARE: 2:46PM-Discussed treatment plan which includes CXR, head XR, UA, pain medications, and blood tests with pt at  bedside and pt agreed to plan.  Results for orders placed during the hospital encounter of 07/04/12  CBC WITH DIFFERENTIAL      Component Value Range   WBC 18.7 (*) 4.0 - 10.5 K/uL   RBC 4.86  4.22 - 5.81 MIL/uL   Hemoglobin 15.6  13.0 - 17.0 g/dL   HCT 16.1  09.6 - 04.5 %   MCV 88.9  78.0 - 100.0 fL   MCH 32.1  26.0 - 34.0 pg   MCHC 36.1 (*) 30.0 - 36.0 g/dL   RDW 40.9  81.1 - 91.4 %   Platelets 237  150 - 400 K/uL   Neutrophils Relative 89 (*) 43 - 77 %   Neutro Abs 16.6 (*) 1.7 - 7.7 K/uL   Lymphocytes Relative 6 (*) 12 - 46 %   Lymphs Abs 1.1  0.7 - 4.0 K/uL   Monocytes Relative 5  3 - 12 %   Monocytes Absolute 1.0  0.1 - 1.0 K/uL   Eosinophils Relative 0  0 - 5 %   Eosinophils Absolute 0.0  0.0 - 0.7 K/uL   Basophils Relative 0  0 - 1 %   Basophils Absolute 0.0  0.0 - 0.1 K/uL  BASIC METABOLIC PANEL      Component Value Range   Sodium 130 (*) 135 - 145 mEq/L   Potassium 2.7 (*) 3.5 - 5.1 mEq/L   Chloride 95 (*) 96 - 112 mEq/L   CO2 23  19 - 32 mEq/L   Glucose, Bld 113 (*) 70 - 99 mg/dL   BUN 13  6 - 23 mg/dL   Creatinine, Ser 7.82  0.50 - 1.35 mg/dL   Calcium 9.5  8.4 - 95.6 mg/dL   GFR calc non Af Amer 82 (*) >90 mL/min   GFR calc Af Amer >90  >90 mL/min  URINALYSIS, ROUTINE W REFLEX MICROSCOPIC      Component Value Range   Color, Urine YELLOW  YELLOW   APPearance CLOUDY (*) CLEAR   Specific Gravity, Urine >1.030 (*) 1.005 - 1.030   pH 6.0  5.0 - 8.0   Glucose, UA NEGATIVE  NEGATIVE mg/dL   Hgb urine dipstick LARGE (*) NEGATIVE   Bilirubin Urine SMALL (*) NEGATIVE   Ketones, ur TRACE (*) NEGATIVE mg/dL   Protein, ur 213 (*) NEGATIVE mg/dL   Urobilinogen, UA 0.2  0.0 - 1.0 mg/dL   Nitrite NEGATIVE  NEGATIVE   Leukocytes, UA NEGATIVE  NEGATIVE  URINE MICROSCOPIC-ADD ON      Component Value Range   WBC, UA 7-10  <3 WBC/hpf   RBC / HPF 11-20  <3 RBC/hpf   Bacteria, UA MANY (*) RARE   Casts GRANULAR CAST (*) NEGATIVE  CBC      Component Value Range   WBC  14.4 (*) 4.0 - 10.5 K/uL  RBC 4.57  4.22 - 5.81 MIL/uL   Hemoglobin 14.2  13.0 - 17.0 g/dL   HCT 16.1  09.6 - 04.5 %   MCV 89.7  78.0 - 100.0 fL   MCH 31.1  26.0 - 34.0 pg   MCHC 34.6  30.0 - 36.0 g/dL   RDW 40.9  81.1 - 91.4 %   Platelets 230  150 - 400 K/uL  CREATININE, SERUM      Component Value Range   Creatinine, Ser 0.73  0.50 - 1.35 mg/dL   GFR calc non Af Amer >90  >90 mL/min   GFR calc Af Amer >90  >90 mL/min    Ct Head Wo Contrast  07/04/2012  *RADIOLOGY REPORT*  Clinical Data:  Headache, right jaw pain  CT HEAD WITHOUT CONTRAST CT MAXILLOFACIAL WITHOUT CONTRAST  Technique:  Multidetector CT imaging of the head and maxillofacial structures were performed using the standard protocol without intravenous contrast. Multiplanar CT image reconstructions of the maxillofacial structures were also generated.  Comparison:  None.  CT HEAD  Findings: No evidence of parenchymal hemorrhage or extra-axial fluid collection. No mass lesion, mass effect, or midline shift.  No CT evidence of acute infarction.  Subcortical white matter and periventricular small vessel ischemic changes.  Intracranial atherosclerosis.  Cerebral volume is age appropriate.  No ventriculomegaly.  The visualized paranasal sinuses are essentially clear. The mastoid air cells are unopacified.  No evidence of calvarial fracture.  IMPRESSION: No evidence of acute intracranial abnormality.  Small vessel ischemic changes with intracranial atherosclerosis.  CT MAXILLOFACIAL  Findings:   No evidence of maxillofacial fracture.  Mild mucosal thickening in the right maxillary sinus.  The visualized paranasal sinuses and mastoid air cells are otherwise essentially clear.  Apical lucency involving the root of the right lower third molar.  Visualized upper cervical spine is within normal limits.  IMPRESSION: No evidence of maxillofacial fracture.  Apical lucency involving the right third molar, suspicious for periapical dental abscess.   Original Report Authenticated By: Charline Bills, M.D.   Dg Abd Acute W/chest  07/04/2012  *RADIOLOGY REPORT*  Clinical Data: Fever, nausea and vomiting.  ACUTE ABDOMEN SERIES (ABDOMEN 2 VIEW & CHEST 1 VIEW)  Comparison: No priors.  Findings: There is an area of airspace consolidation in the right upper lobe, concerning for pneumonia.  Lungs are otherwise clear. No pleural effusions.  Pulmonary vasculature and the cardiomediastinal silhouette are within normal limits. Atherosclerosis in the thoracic aorta.  No pneumoperitoneum.  Supine and upright views of the abdomen demonstrate gas and stool scattered throughout the colon extending to the level of the distal rectum.  There is several nondilated loops of gas-filled small bowel scattered throughout the central abdomen.  IMPRESSION: 1.  Nonspecific nonobstructive bowel gas pattern.  No pneumoperitoneum. 2.  Right upper lobe airspace consolidation concerning for pneumonia. 3.  Atherosclerosis.  Original Report Authenticated By: Florencia Reasons, M.D.   Lab workup showed WBC of 18, 700 with 89% neutrophils.  Acute abdominal series showed RUL Pneumonia.  CT of head was negative.  CT maxillofacial showed evidence of periapical dental abscess of the right lower third molar.  Case discussed with Crista Curb, M.D., who accepts pt for admission.  Rx with IV Rocephin and azithromycin.      1. CAP (community acquired pneumonia)   2. Hypokalemia   3. Nausea and vomiting   4. Tobacco abuse     I personally performed the services described in this documentation, which was scribed in my  presence. The recorded information has been reviewed and considered.  Osvaldo Human, M.D.        Glen Cooper III, MD 07/05/12 1051

## 2012-07-05 DIAGNOSIS — R112 Nausea with vomiting, unspecified: Secondary | ICD-10-CM | POA: Diagnosis present

## 2012-07-05 DIAGNOSIS — I10 Essential (primary) hypertension: Secondary | ICD-10-CM | POA: Diagnosis present

## 2012-07-05 DIAGNOSIS — E871 Hypo-osmolality and hyponatremia: Secondary | ICD-10-CM | POA: Diagnosis present

## 2012-07-05 DIAGNOSIS — E785 Hyperlipidemia, unspecified: Secondary | ICD-10-CM | POA: Diagnosis present

## 2012-07-05 DIAGNOSIS — Z720 Tobacco use: Secondary | ICD-10-CM | POA: Diagnosis present

## 2012-07-05 DIAGNOSIS — J189 Pneumonia, unspecified organism: Secondary | ICD-10-CM | POA: Diagnosis present

## 2012-07-05 DIAGNOSIS — Z87442 Personal history of urinary calculi: Secondary | ICD-10-CM | POA: Diagnosis present

## 2012-07-05 DIAGNOSIS — E876 Hypokalemia: Secondary | ICD-10-CM | POA: Diagnosis present

## 2012-07-05 LAB — CREATININE, SERUM
GFR calc Af Amer: >90 mL/min (ref >90)
GFR calc non Af Amer: >90 mL/min (ref >90)

## 2012-07-05 LAB — CBC
MCV: 89.7 fL (ref 78.0–100.0)
Platelets: 230 10*3/uL (ref 150–400)
RBC: 4.57 MIL/uL (ref 4.22–5.81)
RDW: 14 % (ref 11.5–15.5)
WBC: 14.4 10*3/uL — ABNORMAL HIGH (ref 4.0–10.5)

## 2012-07-05 MED ORDER — TRAZODONE HCL 50 MG PO TABS
ORAL_TABLET | ORAL | Status: AC
  Administered 2012-07-05: 50 mg
  Filled 2012-07-05: qty 1

## 2012-07-05 MED ORDER — ONDANSETRON HCL 4 MG/2ML IJ SOLN: 4.0000 mg | Freq: Four times a day (QID) | INTRAMUSCULAR | Status: DC | PRN

## 2012-07-05 MED ORDER — TRAZODONE HCL 50 MG PO TABS: 50.0000 mg | ORAL_TABLET | Freq: Every evening | ORAL | Status: DC | PRN

## 2012-07-05 MED ORDER — SODIUM CHLORIDE 0.9 % IV SOLN: INTRAVENOUS | Status: DC

## 2012-07-05 MED ORDER — ACETAMINOPHEN 325 MG PO TABS
ORAL_TABLET | ORAL | Status: AC
  Administered 2012-07-05: 650 mg
  Filled 2012-07-05: qty 2

## 2012-07-05 MED ORDER — NICOTINE 21 MG/24HR TD PT24
21.0000 mg | MEDICATED_PATCH | Freq: Every day | TRANSDERMAL | Status: DC
  Administered 2012-07-06: 21 mg via TRANSDERMAL
  Filled 2012-07-05: qty 1

## 2012-07-05 MED ORDER — ACETAMINOPHEN 650 MG RE SUPP: 650.0000 mg | Freq: Four times a day (QID) | RECTAL | Status: DC | PRN

## 2012-07-05 MED ORDER — ALBUTEROL SULFATE (5 MG/ML) 0.5% IN NEBU: 2.5000 mg | INHALATION_SOLUTION | RESPIRATORY_TRACT | Status: DC | PRN

## 2012-07-05 MED ORDER — SENNA 8.6 MG PO TABS: 2.0000 | ORAL_TABLET | Freq: Every day | ORAL | Status: DC | PRN

## 2012-07-05 MED ORDER — ALUM & MAG HYDROXIDE-SIMETH 200-200-20 MG/5ML PO SUSP: 30.0000 mL | Freq: Four times a day (QID) | ORAL | Status: DC | PRN

## 2012-07-05 MED ORDER — OXYCODONE-ACETAMINOPHEN 5-325 MG PO TABS
ORAL_TABLET | ORAL | Status: AC
  Administered 2012-07-05: 1
  Filled 2012-07-05: qty 1

## 2012-07-05 MED ORDER — POTASSIUM CHLORIDE 10 MEQ/100ML IV SOLN
INTRAVENOUS | Status: AC
  Administered 2012-07-05: 10 meq via INTRAVENOUS
  Filled 2012-07-05: qty 100

## 2012-07-05 MED ORDER — MORPHINE SULFATE 2 MG/ML IJ SOLN: 1.0000 mg | INTRAMUSCULAR | Status: DC | PRN

## 2012-07-05 MED ORDER — POTASSIUM CHLORIDE 10 MEQ/100ML IV SOLN
INTRAVENOUS | Status: AC
  Filled 2012-07-05: qty 100

## 2012-07-05 MED ORDER — ONDANSETRON HCL 4 MG PO TABS: 4.0000 mg | ORAL_TABLET | Freq: Four times a day (QID) | ORAL | Status: DC | PRN

## 2012-07-05 MED ORDER — DEXTROSE 5 % IV SOLN
1.0000 g | INTRAVENOUS | Status: DC
  Administered 2012-07-05: 1 g via INTRAVENOUS
  Filled 2012-07-05 (×2): qty 10

## 2012-07-05 MED ORDER — HEPARIN SODIUM (PORCINE) 5000 UNIT/ML IJ SOLN
5000.0000 [IU] | Freq: Three times a day (TID) | INTRAMUSCULAR | Status: DC
  Administered 2012-07-05 – 2012-07-06 (×4): 5000 [IU] via SUBCUTANEOUS
  Filled 2012-07-05 (×5): qty 1

## 2012-07-05 MED ORDER — HYDROCODONE-ACETAMINOPHEN 5-325 MG PO TABS: 1.0000 | ORAL_TABLET | Freq: Four times a day (QID) | ORAL | Status: DC | PRN

## 2012-07-05 MED ORDER — ACETAMINOPHEN 325 MG PO TABS
650.0000 mg | ORAL_TABLET | ORAL | Status: DC | PRN
  Administered 2012-07-05 – 2012-07-06 (×3): 650 mg via ORAL
  Filled 2012-07-05 (×3): qty 2

## 2012-07-05 MED ORDER — POTASSIUM CHLORIDE 10 MEQ/100ML IV SOLN
10.0000 meq | INTRAVENOUS | Status: AC
  Administered 2012-07-05 (×4): 10 meq via INTRAVENOUS
  Filled 2012-07-05 (×2): qty 100

## 2012-07-05 MED ORDER — DEXTROSE 5 % IV SOLN
500.0000 mg | INTRAVENOUS | Status: DC
  Administered 2012-07-05: 500 mg via INTRAVENOUS
  Filled 2012-07-05 (×2): qty 500

## 2012-07-05 MED ORDER — MOXIFLOXACIN HCL 400 MG PO TABS
400.0000 mg | ORAL_TABLET | Freq: Every day | ORAL | Status: DC
  Administered 2012-07-05: 400 mg via ORAL
  Filled 2012-07-05: qty 1

## 2012-07-05 NOTE — Progress Notes (Signed)
Subjective: No further nausea.  Had fevers and chills. Headache.  Not much cough or dyspnea.  Objective: Vital signs in last 24 hours: Filed Vitals:   07/05/12 1215 07/05/12 1439 07/05/12 1955 07/05/12 2021  BP:  106/71  131/83  Pulse:  102  106  Temp: 100.2 F (37.9 C) 100.6 F (38.1 C) 100.5 F (38.1 C) 101.1 F (38.4 C)  TempSrc:   Oral Oral  Resp:  20  20  Height:      Weight:      SpO2:  99%  99%   Weight change:   Intake/Output Summary (Last 24 hours) at 07/05/12 2053 Last data filed at 07/05/12 1730  Gross per 24 hour  Intake    440 ml  Output      0 ml  Net    440 ml   Exam unchanged from 07/04/12  Lab Results: Basic Metabolic Panel:  Lab 07/05/12 1610 07/04/12 1457  NA -- 130*  K -- 2.7*  CL -- 95*  CO2 -- 23  GLUCOSE -- 113*  BUN -- 13  CREATININE 0.73 1.05  CALCIUM -- 9.5  MG -- --  PHOS -- --   Liver Function Tests: No results found for this basename: AST:2,ALT:2,ALKPHOS:2,BILITOT:2,PROT:2,ALBUMIN:2 in the last 168 hours No results found for this basename: LIPASE:2,AMYLASE:2 in the last 168 hours No results found for this basename: AMMONIA:2 in the last 168 hours CBC:  Lab 07/05/12 0919 07/04/12 1457  WBC 14.4* 18.7*  NEUTROABS -- 16.6*  HGB 14.2 15.6  HCT 41.0 43.2  MCV 89.7 88.9  PLT 230 237   Cardiac Enzymes: No results found for this basename: CKTOTAL:3,CKMB:3,CKMBINDEX:3,TROPONINI:3 in the last 168 hours BNP: No results found for this basename: PROBNP:3 in the last 168 hours D-Dimer: No results found for this basename: DDIMER:2 in the last 168 hours CBG: No results found for this basename: GLUCAP:6 in the last 168 hours Hemoglobin A1C: No results found for this basename: HGBA1C in the last 168 hours Fasting Lipid Panel: No results found for this basename: CHOL,HDL,LDLCALC,TRIG,CHOLHDL,LDLDIRECT in the last 960 hours Thyroid Function Tests: No results found for this basename: TSH,T4TOTAL,FREET4,T3FREE,THYROIDAB in the last 168  hours Coagulation: No results found for this basename: LABPROT:4,INR:4 in the last 168 hours Anemia Panel: No results found for this basename: VITAMINB12,FOLATE,FERRITIN,TIBC,IRON,RETICCTPCT in the last 168 hours Urine Drug Screen: Drugs of Abuse  No results found for this basename: labopia, cocainscrnur, labbenz, amphetmu, thcu, labbarb    Alcohol Level: No results found for this basename: ETH:2 in the last 168 hours Urinalysis:  Lab 07/04/12 1507  COLORURINE YELLOW  LABSPEC >1.030*  PHURINE 6.0  GLUCOSEU NEGATIVE  HGBUR LARGE*  BILIRUBINUR SMALL*  KETONESUR TRACE*  PROTEINUR 100*  UROBILINOGEN 0.2  NITRITE NEGATIVE  LEUKOCYTESUR NEGATIVE    Micro Results: No results found for this or any previous visit (from the past 240 hour(s)). Studies/Results: Ct Head Wo Contrast  07/04/2012  *RADIOLOGY REPORT*  Clinical Data:  Headache, right jaw pain  CT HEAD WITHOUT CONTRAST CT MAXILLOFACIAL WITHOUT CONTRAST  Technique:  Multidetector CT imaging of the head and maxillofacial structures were performed using the standard protocol without intravenous contrast. Multiplanar CT image reconstructions of the maxillofacial structures were also generated.  Comparison:  None.  CT HEAD  Findings: No evidence of parenchymal hemorrhage or extra-axial fluid collection. No mass lesion, mass effect, or midline shift.  No CT evidence of acute infarction.  Subcortical white matter and periventricular small vessel ischemic changes.  Intracranial atherosclerosis.  Cerebral volume is age appropriate.  No ventriculomegaly.  The visualized paranasal sinuses are essentially clear. The mastoid air cells are unopacified.  No evidence of calvarial fracture.  IMPRESSION: No evidence of acute intracranial abnormality.  Small vessel ischemic changes with intracranial atherosclerosis.  CT MAXILLOFACIAL  Findings:   No evidence of maxillofacial fracture.  Mild mucosal thickening in the right maxillary sinus.  The visualized  paranasal sinuses and mastoid air cells are otherwise essentially clear.  Apical lucency involving the root of the right lower third molar.  Visualized upper cervical spine is within normal limits.  IMPRESSION: No evidence of maxillofacial fracture.  Apical lucency involving the right third molar, suspicious for periapical dental abscess.  Original Report Authenticated By: Charline Bills, M.D.   Dg Abd Acute W/chest  07/04/2012  *RADIOLOGY REPORT*  Clinical Data: Fever, nausea and vomiting.  ACUTE ABDOMEN SERIES (ABDOMEN 2 VIEW & CHEST 1 VIEW)  Comparison: No priors.  Findings: There is an area of airspace consolidation in the right upper lobe, concerning for pneumonia.  Lungs are otherwise clear. No pleural effusions.  Pulmonary vasculature and the cardiomediastinal silhouette are within normal limits. Atherosclerosis in the thoracic aorta.  No pneumoperitoneum.  Supine and upright views of the abdomen demonstrate gas and stool scattered throughout the colon extending to the level of the distal rectum.  There is several nondilated loops of gas-filled small bowel scattered throughout the central abdomen.  IMPRESSION: 1.  Nonspecific nonobstructive bowel gas pattern.  No pneumoperitoneum. 2.  Right upper lobe airspace consolidation concerning for pneumonia. 3.  Atherosclerosis.  Original Report Authenticated By: Florencia Reasons, M.D.   Ct Maxillofacial Wo Cm  07/04/2012  *RADIOLOGY REPORT*  Clinical Data:  Headache, right jaw pain  CT HEAD WITHOUT CONTRAST CT MAXILLOFACIAL WITHOUT CONTRAST  Technique:  Multidetector CT imaging of the head and maxillofacial structures were performed using the standard protocol without intravenous contrast. Multiplanar CT image reconstructions of the maxillofacial structures were also generated.  Comparison:  None.  CT HEAD  Findings: No evidence of parenchymal hemorrhage or extra-axial fluid collection. No mass lesion, mass effect, or midline shift.  No CT evidence of acute  infarction.  Subcortical white matter and periventricular small vessel ischemic changes.  Intracranial atherosclerosis.  Cerebral volume is age appropriate.  No ventriculomegaly.  The visualized paranasal sinuses are essentially clear. The mastoid air cells are unopacified.  No evidence of calvarial fracture.  IMPRESSION: No evidence of acute intracranial abnormality.  Small vessel ischemic changes with intracranial atherosclerosis.  CT MAXILLOFACIAL  Findings:   No evidence of maxillofacial fracture.  Mild mucosal thickening in the right maxillary sinus.  The visualized paranasal sinuses and mastoid air cells are otherwise essentially clear.  Apical lucency involving the root of the right lower third molar.  Visualized upper cervical spine is within normal limits.  IMPRESSION: No evidence of maxillofacial fracture.  Apical lucency involving the right third molar, suspicious for periapical dental abscess.  Original Report Authenticated By: Charline Bills, M.D.   Scheduled Meds:   . acetaminophen      . acetaminophen      . heparin  5,000 Units Subcutaneous Q8H  . morphine      . moxifloxacin  400 mg Oral q1800  . nicotine      . nicotine  21 mg Transdermal Q0600  . oxyCODONE-acetaminophen      . potassium chloride  10 mEq Intravenous Q1 Hr x 4  . potassium chloride      . traZODone      .  DISCONTD: azithromycin  500 mg Intravenous Q24H  . DISCONTD: cefTRIAXone (ROCEPHIN)  IV  1 g Intravenous Q24H   Continuous Infusions:   . DISCONTD: sodium chloride 100 mL/hr at 07/05/12 0900   PRN Meds:.acetaminophen, albuterol, alum & mag hydroxide-simeth, HYDROcodone-acetaminophen, ondansetron (ZOFRAN) IV, ondansetron, senna, traZODone, DISCONTD: acetaminophen, DISCONTD:  morphine injection Assessment/Plan: Principal Problem:  *CAP (community acquired pneumonia) Active Problems:  Hypokalemia  Nausea and vomiting  Benign hypertension  Hyponatremia  Tobacco abuse  Hyperlipidemia  History of  nephrolithiasis  Change to PO antibiotics. Replete potassium. Hep lock. Repeat labs in am.  Monitor.   LOS: 1 day   Micheline Markes L 07/05/2012, 8:53 PM

## 2012-07-05 NOTE — H&P (Signed)
Hospital Admission Note Date: 07/05/2012  Patient name: Glen Livingston Medical record number: [id number] Date of birth: October 14, 1961 Age: 51 y.o. Gender: male PCP: [facility]  Attending physician: Christiane Ha, MD  Chief Complaint: Headache fever and vomiting  History of Present Illness:  Glen Livingston is an 52 y.o. male who presents with headache, nausea vomiting diarrhea and myalgias. He denies cough or shortness of breath. He has been unable to eat over the past several days. His temperature in the emergency room was 99. White blood cell count is 18,000. Urinalysis shows no nitrites or leukocyte esterase. CT head and maxillofacial shows mild mucosal thickening in the right maxillary sinus. Acute abdominal series shows right upper lobe airspace consolidation. Patient has been given Rocephin and azithromycin. He smokes cigarettes.  Past Medical History  Diagnosis Date  . Hypertension   . High cholesterol   . Renal disorder     Meds: Prescriptions prior to admission  Medication Sig Dispense Refill  . aspirin EC 81 MG tablet Take 81 mg by mouth daily.      Marland Kitchen ibuprofen (ADVIL,MOTRIN) 200 MG tablet Take 800 mg by mouth every 6 (six) hours as needed. pain      . simvastatin (ZOCOR) 10 MG tablet Take 10 mg by mouth at bedtime.        Allergies: Bee venom History   Social History  . Marital Status: Married    Spouse Name: N/A    Number of Children: N/A  . Years of Education: N/A   Occupational History  . Not on file.   Social History Main Topics  . Smoking status: Current Everyday Smoker    Types: Cigarettes  . Smokeless tobacco: Not on file  . Alcohol Use: No  . Drug Use: No  . Sexually Active:    Other Topics Concern  . Not on file   Social History Narrative  . No narrative on file   Family history is negative for cancer.  Past Surgical History  Procedure Date  . Skin cancer excision     Review of Systems: Systems reviewed and as per HPI, otherwise  negative.  Physical Exam: Blood pressure 101/59, pulse 91, temperature 98.6 F (37 C), resp. rate 20, height 5\' 4"  (1.626 m), weight 72.576 kg (160 lb), SpO2 98.00%. BP 119/82  Pulse 93  Temp 98.5 F (36.9 C) (Oral)  Resp 20  Ht 5\' 4"  (1.626 m)  Wt 72.576 kg (160 lb)  BMI 27.46 kg/m2  SpO2 95%  General Appearance:    Alert, cooperative, no distress, appears stated age  Head:    Normocephalic, without obvious abnormality, atraumatic  Eyes:    PERRL, conjunctiva/corneas clear, EOM's intact, fundi    benign, both eyes       Ears:    Normal TM's and external ear canals, both ears  Nose:   Nares normal, septum midline, mucosa normal, no drainage    or sinus tenderness  Throat:   Lips, mucosa, and tongue normal; teeth and gums normal  Neck:   Supple, symmetrical, trachea midline, no adenopathy;       thyroid:  No enlargement/tenderness/nodules; no carotid   bruit or JVD  Back:     Symmetric, no curvature, ROM normal, no CVA tenderness  Lungs:     Clear to auscultation bilaterally, respirations unlabored  Chest wall:    No tenderness or deformity  Heart:    Regular rate and rhythm, S1 and S2 normal, no murmur, rub   or  gallop  Abdomen:     Soft, non-tender, bowel sounds active all four quadrants,    no masses, no organomegaly  Genitalia:  deferred  Rectal:  deferred  Extremities:   Extremities normal, atraumatic, no cyanosis or edema  Pulses:   2+ and symmetric all extremities  Skin:   Skin color, texture, turgor normal, no rashes or lesions  Lymph nodes:   Cervical, supraclavicular, and axillary nodes normal  Neurologic:   CNII-XII intact. Normal strength, sensation and reflexes      throughout    Psychiatric: Normal affect  Lab results: Basic Metabolic Panel:  Basename 07/04/12 1457  NA 130*  K 2.7*  CL 95*  CO2 23  GLUCOSE 113*  BUN 13  CREATININE 1.05  CALCIUM 9.5  MG --  PHOS --   Liver Function Tests: No results found for this basename:  AST:2,ALT:2,ALKPHOS:2,BILITOT:2,PROT:2,ALBUMIN:2 in the last 72 hours No results found for this basename: LIPASE:2,AMYLASE:2 in the last 72 hours No results found for this basename: AMMONIA:2 in the last 72 hours CBC:  Basename 07/04/12 1457  WBC 18.7*  NEUTROABS 16.6*  HGB 15.6  HCT 43.2  MCV 88.9  PLT 237   Cardiac Enzymes: No results found for this basename: CKTOTAL:3,CKMB:3,CKMBINDEX:3,TROPONINI:3 in the last 72 hours BNP: No results found for this basename: PROBNP:3 in the last 72 hours D-Dimer: No results found for this basename: DDIMER:2 in the last 72 hours CBG: No results found for this basename: GLUCAP:6 in the last 72 hours Hemoglobin A1C: No results found for this basename: HGBA1C in the last 72 hours Fasting Lipid Panel: No results found for this basename: CHOL,HDL,LDLCALC,TRIG,CHOLHDL,LDLDIRECT in the last 72 hours Thyroid Function Tests: No results found for this basename: TSH,T4TOTAL,FREET4,T3FREE,THYROIDAB in the last 72 hours Anemia Panel: No results found for this basename: VITAMINB12,FOLATE,FERRITIN,TIBC,IRON,RETICCTPCT in the last 72 hours Coagulation: No results found for this basename: LABPROT:2,INR:2 in the last 72 hours Urine Drug Screen: Drugs of Abuse  No results found for this basename: labopia, cocainscrnur, labbenz, amphetmu, thcu, labbarb    Alcohol Level: No results found for this basename: ETH:2 in the last 72 hours Urinalysis:  Basename 07/04/12 1507  COLORURINE YELLOW  LABSPEC >1.030*  PHURINE 6.0  GLUCOSEU NEGATIVE  HGBUR LARGE*  BILIRUBINUR SMALL*  KETONESUR TRACE*  PROTEINUR 100*  UROBILINOGEN 0.2  NITRITE NEGATIVE  LEUKOCYTESUR NEGATIVE    Imaging results:  Ct Head Wo Contrast  07/04/2012  *RADIOLOGY REPORT*  Clinical Data:  Headache, right jaw pain  CT HEAD WITHOUT CONTRAST CT MAXILLOFACIAL WITHOUT CONTRAST  Technique:  Multidetector CT imaging of the head and maxillofacial structures were performed using the standard  protocol without intravenous contrast. Multiplanar CT image reconstructions of the maxillofacial structures were also generated.  Comparison:  None.  CT HEAD  Findings: No evidence of parenchymal hemorrhage or extra-axial fluid collection. No mass lesion, mass effect, or midline shift.  No CT evidence of acute infarction.  Subcortical white matter and periventricular small vessel ischemic changes.  Intracranial atherosclerosis.  Cerebral volume is age appropriate.  No ventriculomegaly.  The visualized paranasal sinuses are essentially clear. The mastoid air cells are unopacified.  No evidence of calvarial fracture.  IMPRESSION: No evidence of acute intracranial abnormality.  Small vessel ischemic changes with intracranial atherosclerosis.  CT MAXILLOFACIAL  Findings:   No evidence of maxillofacial fracture.  Mild mucosal thickening in the right maxillary sinus.  The visualized paranasal sinuses and mastoid air cells are otherwise essentially clear.  Apical lucency involving the root of the  right lower third molar.  Visualized upper cervical spine is within normal limits.  IMPRESSION: No evidence of maxillofacial fracture.  Apical lucency involving the right third molar, suspicious for periapical dental abscess.  Original Report Authenticated By: Charline Bills, M.D.   Dg Abd Acute W/chest  07/04/2012  *RADIOLOGY REPORT*  Clinical Data: Fever, nausea and vomiting.  ACUTE ABDOMEN SERIES (ABDOMEN 2 VIEW & CHEST 1 VIEW)  Comparison: No priors.  Findings: There is an area of airspace consolidation in the right upper lobe, concerning for pneumonia.  Lungs are otherwise clear. No pleural effusions.  Pulmonary vasculature and the cardiomediastinal silhouette are within normal limits. Atherosclerosis in the thoracic aorta.  No pneumoperitoneum.  Supine and upright views of the abdomen demonstrate gas and stool scattered throughout the colon extending to the level of the distal rectum.  There is several nondilated loops  of gas-filled small bowel scattered throughout the central abdomen.  IMPRESSION: 1.  Nonspecific nonobstructive bowel gas pattern.  No pneumoperitoneum. 2.  Right upper lobe airspace consolidation concerning for pneumonia. 3.  Atherosclerosis.  Original Report Authenticated By: Florencia Reasons, M.D.   Ct Maxillofacial Wo Cm  07/04/2012  *RADIOLOGY REPORT*  Clinical Data:  Headache, right jaw pain  CT HEAD WITHOUT CONTRAST CT MAXILLOFACIAL WITHOUT CONTRAST  Technique:  Multidetector CT imaging of the head and maxillofacial structures were performed using the standard protocol without intravenous contrast. Multiplanar CT image reconstructions of the maxillofacial structures were also generated.  Comparison:  None.  CT HEAD  Findings: No evidence of parenchymal hemorrhage or extra-axial fluid collection. No mass lesion, mass effect, or midline shift.  No CT evidence of acute infarction.  Subcortical white matter and periventricular small vessel ischemic changes.  Intracranial atherosclerosis.  Cerebral volume is age appropriate.  No ventriculomegaly.  The visualized paranasal sinuses are essentially clear. The mastoid air cells are unopacified.  No evidence of calvarial fracture.  IMPRESSION: No evidence of acute intracranial abnormality.  Small vessel ischemic changes with intracranial atherosclerosis.  CT MAXILLOFACIAL  Findings:   No evidence of maxillofacial fracture.  Mild mucosal thickening in the right maxillary sinus.  The visualized paranasal sinuses and mastoid air cells are otherwise essentially clear.  Apical lucency involving the root of the right lower third molar.  Visualized upper cervical spine is within normal limits.  IMPRESSION: No evidence of maxillofacial fracture.  Apical lucency involving the right third molar, suspicious for periapical dental abscess.  Original Report Authenticated By: Charline Bills, M.D.    Assessment & Plan: Principal Problem:  *CAP (community acquired  pneumonia) Active Problems:  Hypokalemia  Nausea and vomiting  Benign hypertension  Hyponatremia  Tobacco abuse  Hyperlipidemia  History of nephrolithiasis  Patient will be admitted. Continue Rocephin and azithromycin. Replete potassium. Anti-emetics. Counseled to quit smoking.  Scharlene Catalina L 07/05/2012, 7:59 AM

## 2012-07-06 DIAGNOSIS — J189 Pneumonia, unspecified organism: Secondary | ICD-10-CM

## 2012-07-06 LAB — BASIC METABOLIC PANEL
CO2: 24 mEq/L (ref 19–32)
Calcium: 9.2 mg/dL (ref 8.4–10.5)
Creatinine, Ser: 0.76 mg/dL (ref 0.50–1.35)
GFR calc Af Amer: >90 mL/min (ref >90)
GFR calc non Af Amer: >90 mL/min (ref >90)
Sodium: 137 mEq/L (ref 135–145)

## 2012-07-06 LAB — CBC
Platelets: 250 10*3/uL (ref 150–400)
RBC: 4.56 MIL/uL (ref 4.22–5.81)
RDW: 14 % (ref 11.5–15.5)
WBC: 11.4 10*3/uL — ABNORMAL HIGH (ref 4.0–10.5)

## 2012-07-06 LAB — URINE CULTURE: Colony Count: NO GROWTH

## 2012-07-06 LAB — MAGNESIUM: Magnesium: 2.1 mg/dL (ref 1.5–2.5)

## 2012-07-06 MED ORDER — IBUPROFEN 200 MG PO TABS: 400.0000 mg | ORAL_TABLET | Freq: Four times a day (QID) | ORAL | Status: DC | PRN

## 2012-07-06 MED ORDER — ACETAMINOPHEN 325 MG PO TABS: 650.0000 mg | ORAL_TABLET | ORAL | Status: AC | PRN

## 2012-07-06 MED ORDER — POTASSIUM CHLORIDE CRYS ER 20 MEQ PO TBCR
40.0000 meq | EXTENDED_RELEASE_TABLET | Freq: Three times a day (TID) | ORAL | Status: DC
  Administered 2012-07-06: 40 meq via ORAL
  Filled 2012-07-06: qty 2

## 2012-07-06 MED ORDER — LEVOFLOXACIN 500 MG PO TABS: 500.0000 mg | ORAL_TABLET | Freq: Every day | ORAL | Status: AC

## 2012-07-06 NOTE — Discharge Summary (Signed)
Physician Discharge Summary  Patient ID: BANNON GIAMMARCO MRN: [id number] DOB/AGE: Mar 20, 1961 51 y.o.  Admit date: 07/04/2012 Discharge date: 07/06/2012  Discharge Diagnoses:  Principal Problem:  *CAP (community acquired pneumonia) Active Problems:  Hypokalemia  Nausea and vomiting  Benign hypertension  Hyponatremia  Tobacco abuse  Hyperlipidemia  History of nephrolithiasis   Medication List  As of 07/06/2012 11:48 AM   TAKE these medications         acetaminophen 325 MG tablet   Commonly known as: TYLENOL   Take 2 tablets (650 mg total) by mouth every 4 (four) hours as needed (or Fever >/= 101).      aspirin EC 81 MG tablet   Take 81 mg by mouth daily.      ibuprofen 200 MG tablet   Commonly known as: ADVIL,MOTRIN   Take 2 tablets (400 mg total) by mouth every 6 (six) hours as needed for pain, fever or headache. pain      levofloxacin 500 MG tablet   Commonly known as: LEVAQUIN   Take 1 tablet (500 mg total) by mouth daily.      simvastatin 10 MG tablet   Commonly known as: ZOCOR   Take 10 mg by mouth at bedtime.            Discharge Orders    Future Orders Please Complete By Expires   Diet - low sodium heart healthy         Follow-up Information    Follow up with Kirk Ruths, MD in 1 month. (to repeat chest xray and check blood pressure)    Contact information:   [address] [place] [address] [phone]          Disposition: 01-Home or Self Care  Discharged Condition: stable  Consults:  none  Labs:   Results for orders placed during the hospital encounter of 07/04/12 (from the past 48 hour(s))  CBC WITH DIFFERENTIAL     Status: Abnormal   Collection Time   07/04/12  2:57 PM      Component Value Range Comment   WBC 18.7 (*) 4.0 - 10.5 K/uL    RBC 4.86  4.22 - 5.81 MIL/uL    Hemoglobin 15.6  13.0 - 17.0 g/dL    HCT 96.2  95.2 - 84.1 %    MCV 88.9  78.0 - 100.0 fL    MCH 32.1  26.0 - 34.0 pg      MCHC 36.1 (*) 30.0 - 36.0 g/dL    RDW 32.4  40.1 - 02.7 %    Platelets 237  150 - 400 K/uL    Neutrophils Relative 89 (*) 43 - 77 %    Neutro Abs 16.6 (*) 1.7 - 7.7 K/uL    Lymphocytes Relative 6 (*) 12 - 46 %    Lymphs Abs 1.1  0.7 - 4.0 K/uL    Monocytes Relative 5  3 - 12 %    Monocytes Absolute 1.0  0.1 - 1.0 K/uL    Eosinophils Relative 0  0 - 5 %    Eosinophils Absolute 0.0  0.0 - 0.7 K/uL    Basophils Relative 0  0 - 1 %    Basophils Absolute 0.0  0.0 - 0.1 K/uL   BASIC METABOLIC PANEL     Status: Abnormal   Collection Time   07/04/12  2:57 PM      Component Value Range Comment   Sodium 130 (*) 135 -  145 mEq/L    Potassium 2.7 (*) 3.5 - 5.1 mEq/L    Chloride 95 (*) 96 - 112 mEq/L    CO2 23  19 - 32 mEq/L    Glucose, Bld 113 (*) 70 - 99 mg/dL    BUN 13  6 - 23 mg/dL    Creatinine, Ser 4.09  0.50 - 1.35 mg/dL    Calcium 9.5  8.4 - 81.1 mg/dL    GFR calc non Af Amer 82 (*) >90 mL/min    GFR calc Af Amer >90  >90 mL/min   URINALYSIS, ROUTINE W REFLEX MICROSCOPIC     Status: Abnormal   Collection Time   07/04/12  3:07 PM      Component Value Range Comment   Color, Urine YELLOW  YELLOW    APPearance CLOUDY (*) CLEAR    Specific Gravity, Urine >1.030 (*) 1.005 - 1.030    pH 6.0  5.0 - 8.0    Glucose, UA NEGATIVE  NEGATIVE mg/dL    Hgb urine dipstick LARGE (*) NEGATIVE    Bilirubin Urine SMALL (*) NEGATIVE    Ketones, ur TRACE (*) NEGATIVE mg/dL    Protein, ur 914 (*) NEGATIVE mg/dL    Urobilinogen, UA 0.2  0.0 - 1.0 mg/dL    Nitrite NEGATIVE  NEGATIVE    Leukocytes, UA NEGATIVE  NEGATIVE   URINE CULTURE     Status: Normal   Collection Time   07/04/12  3:07 PM      Component Value Range Comment   Specimen Description URINE, CLEAN CATCH      Special Requests NONE      Culture  Setup Time 07/04/2012 23:15      Colony Count NO GROWTH      Culture NO GROWTH      Report Status 07/06/2012 FINAL     URINE MICROSCOPIC-ADD ON     Status: Abnormal   Collection Time    07/04/12  3:07 PM      Component Value Range Comment   WBC, UA 7-10  <3 WBC/hpf    RBC / HPF 11-20  <3 RBC/hpf    Bacteria, UA MANY (*) RARE    Casts GRANULAR CAST (*) NEGATIVE   CBC     Status: Abnormal   Collection Time   07/05/12  9:19 AM      Component Value Range Comment   WBC 14.4 (*) 4.0 - 10.5 K/uL    RBC 4.57  4.22 - 5.81 MIL/uL    Hemoglobin 14.2  13.0 - 17.0 g/dL    HCT 78.2  95.6 - 21.3 %    MCV 89.7  78.0 - 100.0 fL    MCH 31.1  26.0 - 34.0 pg    MCHC 34.6  30.0 - 36.0 g/dL    RDW 08.6  57.8 - 46.9 %    Platelets 230  150 - 400 K/uL   CREATININE, SERUM     Status: Normal   Collection Time   07/05/12  9:19 AM      Component Value Range Comment   Creatinine, Ser 0.73  0.50 - 1.35 mg/dL    GFR calc non Af Amer >90  >90 mL/min    GFR calc Af Amer >90  >90 mL/min   BASIC METABOLIC PANEL     Status: Abnormal   Collection Time   07/06/12  4:34 AM      Component Value Range Comment   Sodium 137  135 - 145 mEq/L DELTA  CHECK NOTED   Potassium 3.0 (*) 3.5 - 5.1 mEq/L    Chloride 100  96 - 112 mEq/L    CO2 24  19 - 32 mEq/L    Glucose, Bld 95  70 - 99 mg/dL    BUN 10  6 - 23 mg/dL    Creatinine, Ser 1.61  0.50 - 1.35 mg/dL    Calcium 9.2  8.4 - 09.6 mg/dL    GFR calc non Af Amer >90  >90 mL/min    GFR calc Af Amer >90  >90 mL/min   CBC     Status: Abnormal   Collection Time   07/06/12  4:34 AM      Component Value Range Comment   WBC 11.4 (*) 4.0 - 10.5 K/uL    RBC 4.56  4.22 - 5.81 MIL/uL    Hemoglobin 14.2  13.0 - 17.0 g/dL    HCT 04.5  40.9 - 81.1 %    MCV 89.7  78.0 - 100.0 fL    MCH 31.1  26.0 - 34.0 pg    MCHC 34.7  30.0 - 36.0 g/dL    RDW 91.4  78.2 - 95.6 %    Platelets 250  150 - 400 K/uL   MAGNESIUM     Status: Normal   Collection Time   07/06/12  6:00 AM      Component Value Range Comment   Magnesium 2.1  1.5 - 2.5 mg/dL     Diagnostics:  Ct Abdomen Pelvis Wo Contrast  06/19/2012  *RADIOLOGY REPORT*  Clinical Data: 1-week history of left flank pain.   History of urinary tract calculi.  CT ABDOMEN AND PELVIS WITHOUT CONTRAST  Technique:  Multidetector CT imaging of the abdomen and pelvis was performed following the standard protocol without intravenous contrast.  Comparison: Unenhanced CT abdomen and pelvis 05/15/2004.  Findings: Very small (approximately 3 mm) calculus in the proximal left ureter just distal to the left ureteropelvic junction causing mild left hydronephrosis, left renal edema, and left perinephric edema. Tiny (1-2 mm) calculi in upper and lower pole calyces of the left kidney and a lower pole calix of the right kidney. Approximate 3 mm calculus in an upper pole calix of the right kidney.  No obstructing right ureteral calculi.  Within the limits of the unenhanced technique, no focal parenchymal abnormality involving either kidney.  Normal unenhanced appearance of the liver, spleen, pancreas, and adrenal glands.  Gallbladder unremarkable by CT.  No biliary ductal dilation.  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 No significant lymphadenopathy.  Normal-appearing stomach, small bowel, and colon.  Normal appendix in the right upper pelvis.  No ascites.  Urinary bladder decompressed and unremarkable.  Prostate gland upper normal in size, containing calcifications.  Normal seminal vesicles.  Bone window images demonstrate mild degenerative changes involving the lumbar spine, bilateral L5 pars defects with minimal grade 1 spondylolisthesis of L5 on S1 approximating 3-4 mm. Visualized lung bases clear.  Heart size normal.  IMPRESSION:  1.  Obstructing approximate 3 mm proximal left ureteral calculus. 2.  Non-obstructing bilateral renal calculi. 3.  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 4.  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  Original Report Authenticated By: Arnell Sieving, M.D.   Ct Head Wo Contrast  07/04/2012  *RADIOLOGY REPORT*   Clinical Data:  Headache, right jaw pain  CT HEAD WITHOUT CONTRAST CT MAXILLOFACIAL WITHOUT CONTRAST  Technique:  Multidetector CT imaging  of the head and maxillofacial structures were performed using the standard protocol without intravenous contrast. Multiplanar CT image reconstructions of the maxillofacial structures were also generated.  Comparison:  None.  CT HEAD  Findings: No evidence of parenchymal hemorrhage or extra-axial fluid collection. No mass lesion, mass effect, or midline shift.  No CT evidence of acute infarction.  Subcortical white matter and periventricular small vessel ischemic changes.  Intracranial atherosclerosis.  Cerebral volume is age appropriate.  No ventriculomegaly.  The visualized paranasal sinuses are essentially clear. The mastoid air cells are unopacified.  No evidence of calvarial fracture.  IMPRESSION: No evidence of acute intracranial abnormality.  Small vessel ischemic changes with intracranial atherosclerosis.  CT MAXILLOFACIAL  Findings:   No evidence of maxillofacial fracture.  Mild mucosal thickening in the right maxillary sinus.  The visualized paranasal sinuses and mastoid air cells are otherwise essentially clear.  Apical lucency involving the root of the right lower third molar.  Visualized upper cervical spine is within normal limits.  IMPRESSION: No evidence of maxillofacial fracture.  Apical lucency involving the right third molar, suspicious for periapical dental abscess.  Original Report Authenticated By: Charline Bills, M.D.   Dg Abd Acute W/chest  07/04/2012  *RADIOLOGY REPORT*  Clinical Data: Fever, nausea and vomiting.  ACUTE ABDOMEN SERIES (ABDOMEN 2 VIEW & CHEST 1 VIEW)  Comparison: No priors.  Findings: There is an area of airspace consolidation in the right upper lobe, concerning for pneumonia.  Lungs are otherwise clear. No pleural effusions.  Pulmonary vasculature and the cardiomediastinal silhouette are within normal limits. Atherosclerosis in the  thoracic aorta.  No pneumoperitoneum.  Supine and upright views of the abdomen demonstrate gas and stool scattered throughout the colon extending to the level of the distal rectum.  There is several nondilated loops of gas-filled small bowel scattered throughout the central abdomen.  IMPRESSION: 1.  Nonspecific nonobstructive bowel gas pattern.  No pneumoperitoneum. 2.  Right upper lobe airspace consolidation concerning for pneumonia. 3.  Atherosclerosis.  Original Report Authenticated By: Florencia Reasons, M.D.   Ct Maxillofacial Wo Cm  07/04/2012  *RADIOLOGY REPORT*  Clinical Data:  Headache, right jaw pain  CT HEAD WITHOUT CONTRAST CT MAXILLOFACIAL WITHOUT CONTRAST  Technique:  Multidetector CT imaging of the head and maxillofacial structures were performed using the standard protocol without intravenous contrast. Multiplanar CT image reconstructions of the maxillofacial structures were also generated.  Comparison:  None.  CT HEAD  Findings: No evidence of parenchymal hemorrhage or extra-axial fluid collection. No mass lesion, mass effect, or midline shift.  No CT evidence of acute infarction.  Subcortical white matter and periventricular small vessel ischemic changes.  Intracranial atherosclerosis.  Cerebral volume is age appropriate.  No ventriculomegaly.  The visualized paranasal sinuses are essentially clear. The mastoid air cells are unopacified.  No evidence of calvarial fracture.  IMPRESSION: No evidence of acute intracranial abnormality.  Small vessel ischemic changes with intracranial atherosclerosis.  CT MAXILLOFACIAL  Findings:   No evidence of maxillofacial fracture.  Mild mucosal thickening in the right maxillary sinus.  The visualized paranasal sinuses and mastoid air cells are otherwise essentially clear.  Apical lucency involving the root of the right lower third molar.  Visualized upper cervical spine is within normal limits.  IMPRESSION: No evidence of maxillofacial fracture.  Apical  lucency involving the right third molar, suspicious for periapical dental abscess.  Original Report Authenticated By: Charline Bills, M.D.   Full Code   Hospital Course: See H&P for complete admission details.  The patient is a 51 year old smoker who presented with headache vomiting diarrhea myalgias. He had no cough but was found to have an infiltrate on chest x-ray. His white blood cell count was 18,000. He was afebrile and had normal vital signs. His lungs were clear. He did have a potassium of 2.7 in the emergency room. He was admitted and started on antibiotics for pneumonia. His potassium was repleted. He was encouraged to quit smoking. By the time of discharge, he is feeling much better, eating, headache resolved, ambulating and had stable vital signs. He will need a repeat chest x-ray in a month to assure resolution of the infiltrate. Total time on the day of discharge greater than 30 minutes.  Discharge Exam:  Blood pressure 119/82, pulse 93, temperature 98.5 F (36.9 C), temperature source Oral, resp. rate 20, height 5\' 4"  (1.626 m), weight 72.576 kg (160 lb), SpO2 95.00%.  Lungs:  CTA without WRR CV RRR without MGR   Signed: Davaun Quintela L 07/06/2012, 11:48 AM

## 2012-07-06 NOTE — Progress Notes (Signed)
Discharge instructions given to pt. With teach back given to RN. Pt. Taken to car via W/C. 

## 2012-07-07 NOTE — Progress Notes (Signed)
UR Chart Review Completed  

## 2013-09-19 ENCOUNTER — Telehealth: Payer: Self-pay

## 2013-09-19 NOTE — Telephone Encounter (Signed)
Pt was referred by Dr. Regino Schultze for screening colonoscopy. LMOM for a return call.

## 2013-10-03 NOTE — Telephone Encounter (Signed)
Letter to pcp.  

## 2013-11-16 ENCOUNTER — Encounter (HOSPITAL_COMMUNITY): Payer: Self-pay | Admitting: Emergency Medicine

## 2013-11-16 ENCOUNTER — Emergency Department (HOSPITAL_COMMUNITY)
Admission: EM | Admit: 2013-11-16 | Discharge: 2013-11-16 | Disposition: A | Discharge status: Home / Self Care | Payer: 59 | Attending: Emergency Medicine | Admitting: Emergency Medicine

## 2013-11-16 DIAGNOSIS — K0889 Other specified disorders of teeth and supporting structures: Secondary | ICD-10-CM

## 2013-11-16 DIAGNOSIS — F17200 Nicotine dependence, unspecified, uncomplicated: Secondary | ICD-10-CM | POA: Insufficient documentation

## 2013-11-16 DIAGNOSIS — Z7982 Long term (current) use of aspirin: Secondary | ICD-10-CM | POA: Insufficient documentation

## 2013-11-16 DIAGNOSIS — M549 Dorsalgia, unspecified: Secondary | ICD-10-CM | POA: Insufficient documentation

## 2013-11-16 DIAGNOSIS — Z79899 Other long term (current) drug therapy: Secondary | ICD-10-CM | POA: Insufficient documentation

## 2013-11-16 DIAGNOSIS — E7800 Pure hypercholesterolemia, unspecified: Secondary | ICD-10-CM | POA: Insufficient documentation

## 2013-11-16 DIAGNOSIS — K089 Disorder of teeth and supporting structures, unspecified: Secondary | ICD-10-CM | POA: Insufficient documentation

## 2013-11-16 DIAGNOSIS — Z87442 Personal history of urinary calculi: Secondary | ICD-10-CM | POA: Insufficient documentation

## 2013-11-16 DIAGNOSIS — I10 Essential (primary) hypertension: Secondary | ICD-10-CM | POA: Insufficient documentation

## 2013-11-16 HISTORY — DX: Sciatica, unspecified side: M54.30

## 2013-11-16 HISTORY — DX: Dorsalgia, unspecified: M54.9

## 2013-11-16 HISTORY — DX: Calculus of kidney: N20.0

## 2013-11-16 MED ORDER — KETOROLAC TROMETHAMINE 10 MG PO TABS
10.0000 mg | ORAL_TABLET | Freq: Once | ORAL | Status: AC
  Administered 2013-11-16: 10 mg via ORAL
  Filled 2013-11-16: qty 1

## 2013-11-16 MED ORDER — PENICILLIN V POTASSIUM 250 MG PO TABS
500.0000 mg | ORAL_TABLET | Freq: Once | ORAL | Status: AC
  Administered 2013-11-16: 500 mg via ORAL
  Filled 2013-11-16: qty 2

## 2013-11-16 MED ORDER — ACETAMINOPHEN 500 MG PO TABS
1000.0000 mg | ORAL_TABLET | Freq: Once | ORAL | Status: AC
  Administered 2013-11-16: 1000 mg via ORAL
  Filled 2013-11-16: qty 2

## 2013-11-16 MED ORDER — DICLOFENAC SODIUM 75 MG PO TBEC: 75.0000 mg | DELAYED_RELEASE_TABLET | Freq: Two times a day (BID) | ORAL | Status: DC

## 2013-11-16 MED ORDER — AMOXICILLIN 500 MG PO CAPS: 500.0000 mg | ORAL_CAPSULE | Freq: Three times a day (TID) | ORAL | Status: DC

## 2013-11-16 NOTE — ED Notes (Signed)
Pt c/o left upper dental pain since this morning.

## 2013-11-16 NOTE — ED Provider Notes (Signed)
CSN: [id number]     Arrival date & time 11/16/13  1809 History   First MD Initiated Contact with Patient 11/16/13 1843     Chief Complaint  Patient presents with  . Dental Pain   (Consider location/radiation/quality/duration/timing/severity/associated sxs/prior Treatment) Patient is a 52 y.o. male presenting with tooth pain. The history is provided by the patient.  Dental Pain Location:  Upper Upper teeth location:  14/LU 1st molar Quality:  Aching Severity:  Moderate Onset quality:  Gradual Duration: acute on chronic tooth pain. Timing:  Intermittent Progression:  Worsening Context: poor dentition   Context: not abscess   Relieved by:  Nothing Worsened by:  Nothing tried Associated symptoms: no fever, no neck pain and no oral bleeding     Past Medical History  Diagnosis Date  . Hypertension   . High cholesterol   . Kidney stone   . Back pain   . Sciatica    Past Surgical History  Procedure Laterality Date  . Skin cancer excision     History reviewed. No pertinent family history. History  Substance Use Topics  . Smoking status: Current Every Day Smoker    Types: Cigarettes  . Smokeless tobacco: Not on file  . Alcohol Use: No    Review of Systems  Constitutional: Negative for fever and activity change.       All ROS Neg except as noted in HPI  HENT: Positive for dental problem. Negative for nosebleeds.   Eyes: Negative for photophobia and discharge.  Respiratory: Negative for cough, shortness of breath and wheezing.   Cardiovascular: Negative for chest pain and palpitations.  Gastrointestinal: Negative for abdominal pain and blood in stool.  Genitourinary: Negative for dysuria, frequency and hematuria.  Musculoskeletal: Positive for back pain. Negative for arthralgias and neck pain.  Skin: Negative.   Neurological: Negative for dizziness, seizures and speech difficulty.  Psychiatric/Behavioral: Negative for hallucinations and confusion.    Allergies  Bee  venom  Home Medications   Current Outpatient Rx  Name  Route  Sig  Dispense  Refill  . aspirin EC 81 MG tablet   Oral   Take 81 mg by mouth daily.         Marland Kitchen lisinopril (PRINIVIL,ZESTRIL) 10 MG tablet   Oral   Take 10 mg by mouth daily.         Marland Kitchen oxyCODONE-acetaminophen (PERCOCET) 10-325 MG per tablet   Oral   Take 1 tablet by mouth 4 (four) times daily as needed.         . simvastatin (ZOCOR) 10 MG tablet   Oral   Take 10 mg by mouth at bedtime.         Marland Kitchen amoxicillin (AMOXIL) 500 MG capsule   Oral   Take 1 capsule (500 mg total) by mouth 3 (three) times daily.   21 capsule   0   . diclofenac (VOLTAREN) 75 MG EC tablet   Oral   Take 1 tablet (75 mg total) by mouth 2 (two) times daily.   14 tablet   0   . ibuprofen (ADVIL,MOTRIN) 200 MG tablet   Oral   Take 2 tablets (400 mg total) by mouth every 6 (six) hours as needed for pain, fever or headache. pain   30 tablet       BP 123/87  Pulse 100  Temp(Src) 97.8 F (36.6 C) (Oral)  Resp 20  Ht 5\' 4"  (1.626 m)  Wt 159 lb (72.122 kg)  BMI 27.28 kg/m2  SpO2  98% Physical Exam  Nursing note and vitals reviewed. Constitutional: He is oriented to person, place, and time. He appears well-developed and well-nourished.  Non-toxic appearance.  HENT:  Head: Normocephalic.  Right Ear: Tympanic membrane and external ear normal.  Left Ear: Tympanic membrane and external ear normal.  There is a deep cavity of the left upper first molar. There is mild increased redness of the gum line. No visible abscess appreciated. Airway is patent. There is no swelling under the tongue. No visible abscess appreciated.  Eyes: EOM and lids are normal. Pupils are equal, round, and reactive to light.  Neck: Normal range of motion. Neck supple. Carotid bruit is not present.  Cardiovascular: Normal rate, regular rhythm, normal heart sounds, intact distal pulses and normal pulses.   Pulmonary/Chest: Breath sounds normal. No respiratory  distress.  Abdominal: Soft. Bowel sounds are normal. There is no tenderness. There is no guarding.  Musculoskeletal: Normal range of motion.  Lymphadenopathy:       Head (right side): No submandibular adenopathy present.       Head (left side): No submandibular adenopathy present.    He has no cervical adenopathy.  Neurological: He is alert and oriented to person, place, and time. He has normal strength. No cranial nerve deficit or sensory deficit.  Skin: Skin is warm and dry.  Psychiatric: He has a normal mood and affect. His speech is normal.    ED Course  Procedures (including critical care time) Labs Review Labs Reviewed - No data to display Imaging Review No results found.  EKG Interpretation   None       MDM   1. Toothache    **I have reviewed nursing notes, vital signs, and all appropriate lab and imaging results for this patient.*  Patient presents to the emergency department with complaint of left upper dental pain. Patient states that he has had some problems in the past, but the pain really more severe this morning. Examination does not reveal a visible abscess. The airway is patent. And there is no evidence for Luwig's angina.  Prescription for Amoxil 500 mg, and diclofenac 75 mg given to the patient. Patient invited to use Tylenol in between the diclofenac doses if needed for pain. Patient strongly advised to see the dentist as sone as possible.  Kathie Dike, PA-C 11/16/13 1914

## 2013-11-16 NOTE — ED Provider Notes (Signed)
Medical screening examination/treatment/procedure(s) were performed by non-physician practitioner and as supervising physician I was immediately available for consultation/collaboration.  EKG Interpretation   None         Tetsuo Coppola L Brendan Gadson, MD 11/16/13 2104 

## 2013-12-02 IMAGING — CT CT ABD-PELV W/O CM
2 of 3 series · 16 of 46 positions shown, 18 images · non-contrast
Comparison: Unenhanced CT abdomen and pelvis 05/15/2004.

CLINICAL DATA: 1-week history of left flank pain.  History of
urinary tract calculi.

CT ABDOMEN AND PELVIS WITHOUT CONTRAST
TECHNIQUE: Multidetector CT imaging of the abdomen and pelvis was
performed following the standard protocol without intravenous
contrast.

[Series 2: standard/full over (age)lbs 5.0 · axial · 0.66mm/px · z∈[-476,-91]mm · 13 of 89 slices shown, 15 images]
[im 6/89  soft-tissue]
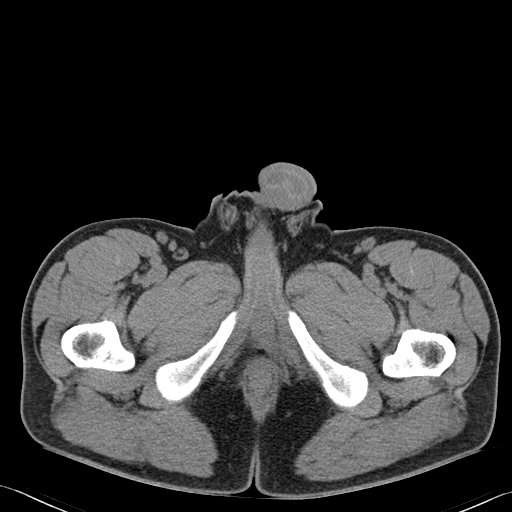
[im 6/89  bone]
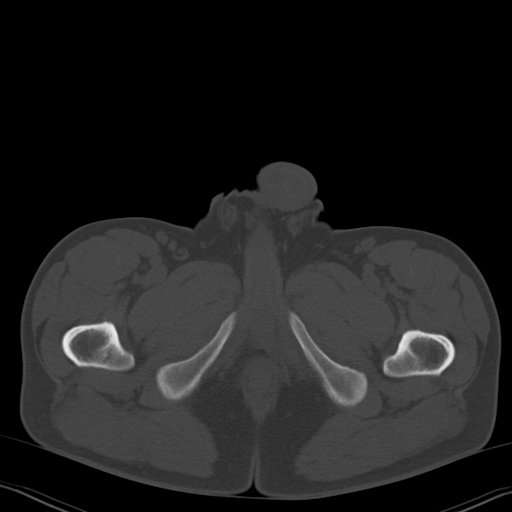
[im 12/89  soft-tissue]
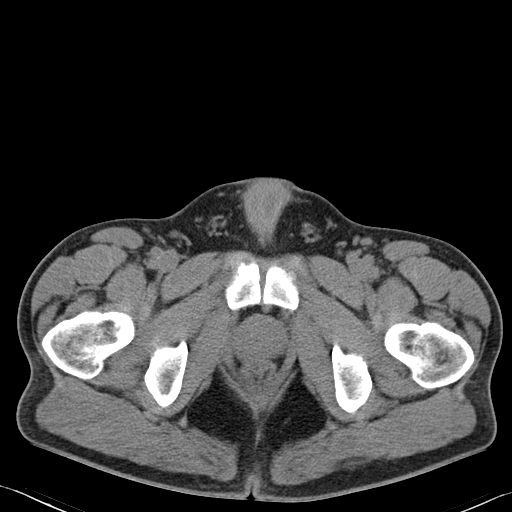
[im 18/89  soft-tissue]
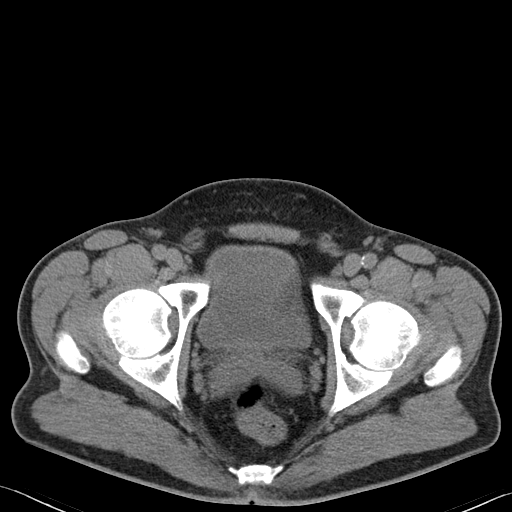
[im 26/89  soft-tissue]
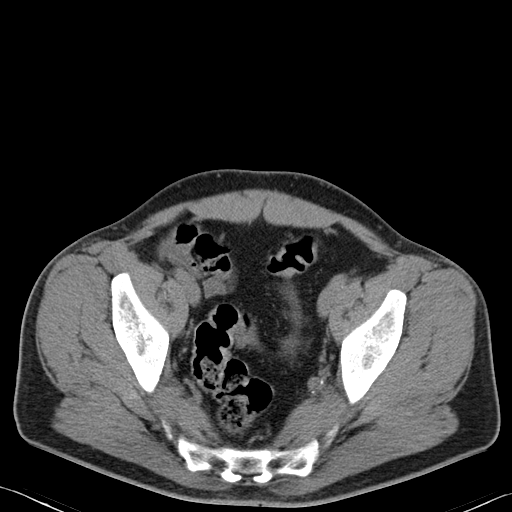
[im 32/89  soft-tissue]
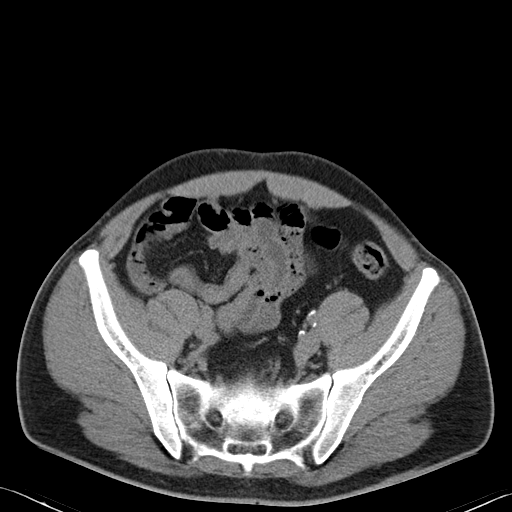
[im 37/89  soft-tissue]
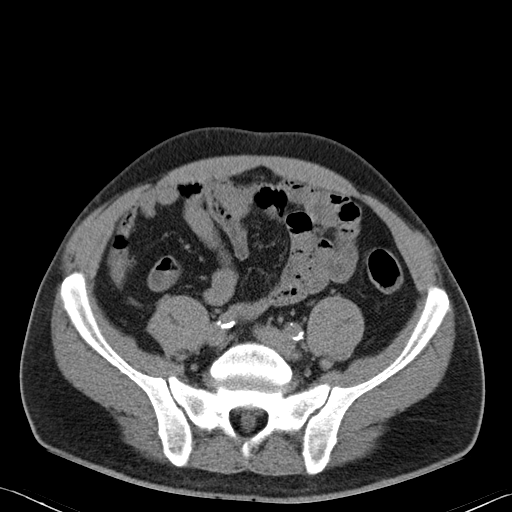
[im 46/89  soft-tissue]
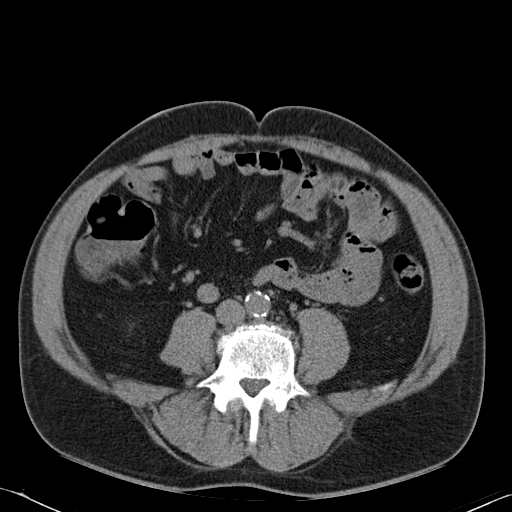
[im 52/89  soft-tissue]
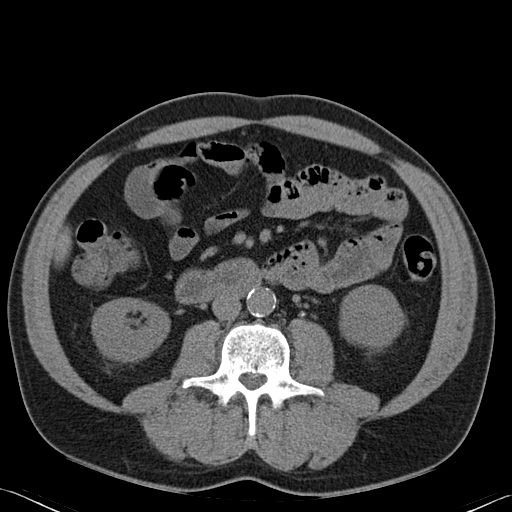
[im 57/89  soft-tissue]
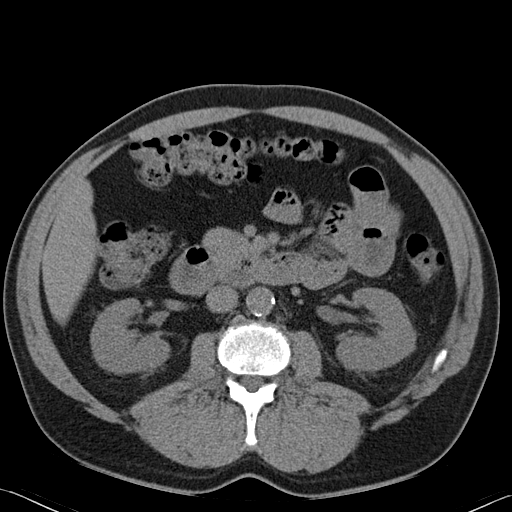
[im 57/89  bone]
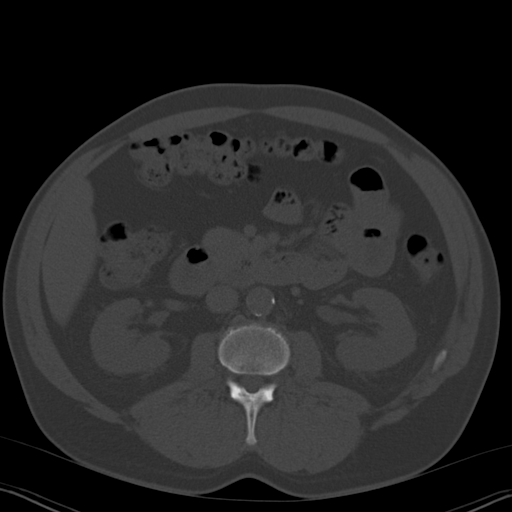
[im 63/89  soft-tissue]
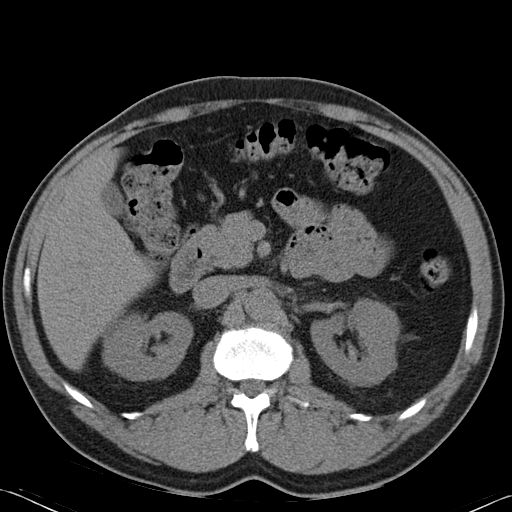
[im 71/89  soft-tissue]
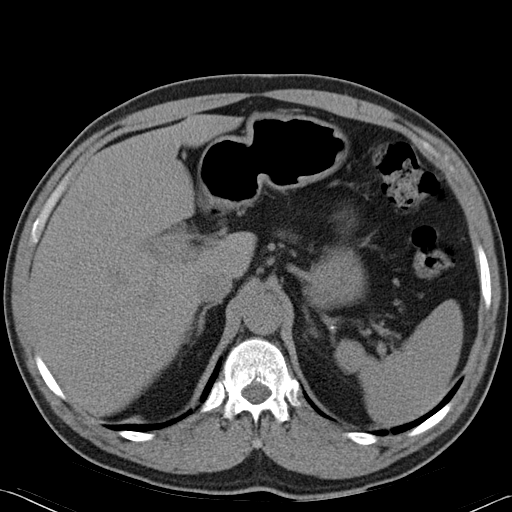
[im 77/89  soft-tissue]
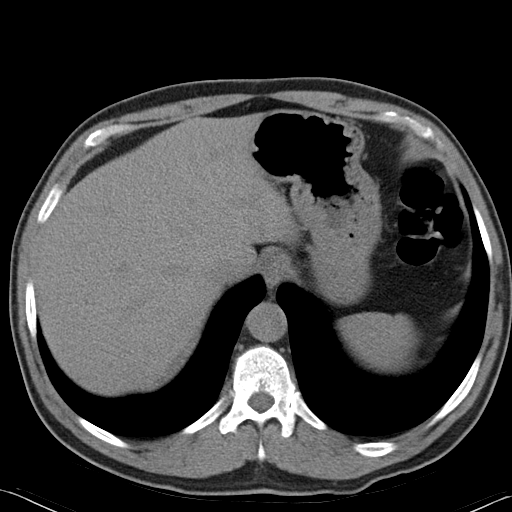
[im 83/89  soft-tissue]
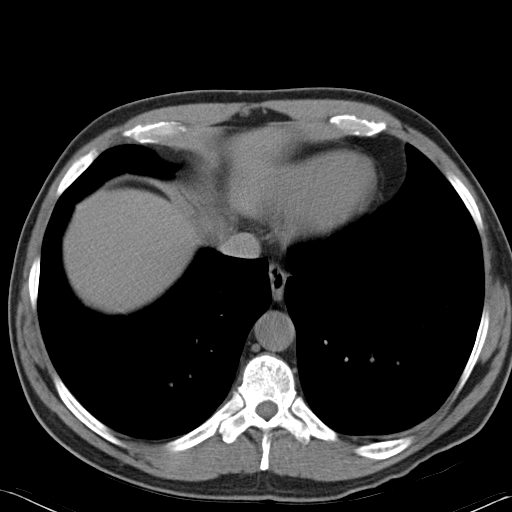

[Series 4: mpr coronal · coronal · 0.77mm/px · 3 of 92 slices shown]
[im 31/92  soft-tissue]
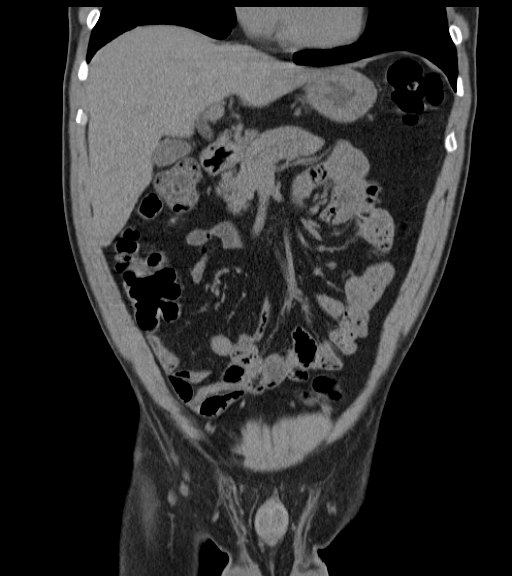
[im 41/92  soft-tissue]
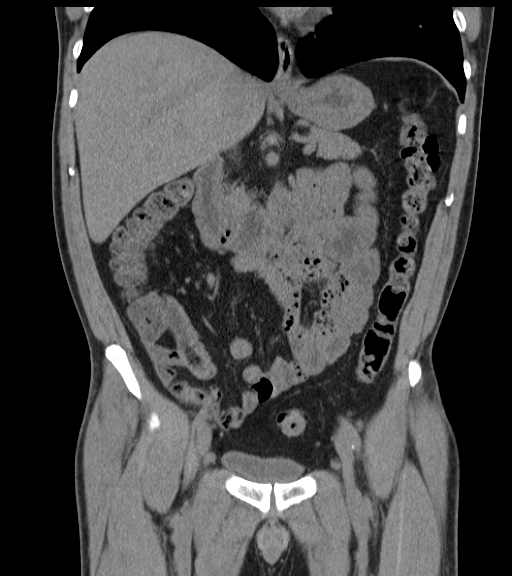
[im 51/92  soft-tissue]
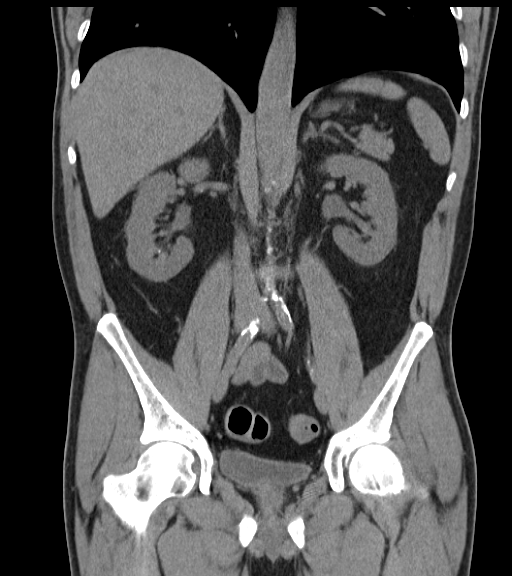

[16 of 46 positions shown; findings below may reference images not displayed]

FINDINGS: Very small (approximately 3 mm) calculus in the proximal
left ureter just distal to the left ureteropelvic junction causing
mild left hydronephrosis, left renal edema, and left perinephric
edema. Tiny (1-2 mm) calculi in upper and lower pole calyces of the
left kidney and a lower pole calix of the right kidney. Approximate
3 mm calculus in an upper pole calix of the right kidney.  No
obstructing right ureteral calculi.  Within the limits of the
unenhanced technique, no focal parenchymal abnormality involving
either kidney.

Normal unenhanced appearance of the liver, spleen, pancreas, and
adrenal glands.  Gallbladder unremarkable by CT.  No biliary ductal
dilation.  Moderate iliofemoral atherosclerosis without aneurysm.
No significant lymphadenopathy.

Normal-appearing stomach, small bowel, and colon.  Normal appendix
in the right upper pelvis.  No ascites.

Urinary bladder decompressed and unremarkable.  Prostate gland
upper normal in size, containing calcifications.  Normal seminal
vesicles.  Bone window images demonstrate mild degenerative changes
involving the lumbar spine, bilateral L5 pars defects with minimal
grade 1 spondylolisthesis of L5 on S1 approximating 3-4 mm.
Visualized lung bases clear.  Heart size normal.
IMPRESSION: 1.  Obstructing approximate 3 mm proximal left ureteral calculus.
2.  Non-obstructing bilateral renal calculi.
3.  Moderate aorto-iliofemoral atherosclerosis which is advanced
for age.
4.  Bilateral L5 pars defects with minimal grade 1
spondylolisthesis of L5 on S1 approximating 304 mm.

## 2014-07-03 ENCOUNTER — Encounter (HOSPITAL_COMMUNITY): Payer: Self-pay | Admitting: Emergency Medicine

## 2014-07-03 ENCOUNTER — Emergency Department (HOSPITAL_COMMUNITY)
Admission: EM | Admit: 2014-07-03 | Discharge: 2014-07-03 | Disposition: A | Discharge status: Home / Self Care | Payer: 59 | Attending: Emergency Medicine | Admitting: Emergency Medicine

## 2014-07-03 DIAGNOSIS — Y929 Unspecified place or not applicable: Secondary | ICD-10-CM | POA: Insufficient documentation

## 2014-07-03 DIAGNOSIS — X500XXA Overexertion from strenuous movement or load, initial encounter: Secondary | ICD-10-CM | POA: Insufficient documentation

## 2014-07-03 DIAGNOSIS — S39012A Strain of muscle, fascia and tendon of lower back, initial encounter: Secondary | ICD-10-CM

## 2014-07-03 DIAGNOSIS — F17200 Nicotine dependence, unspecified, uncomplicated: Secondary | ICD-10-CM | POA: Insufficient documentation

## 2014-07-03 DIAGNOSIS — M5430 Sciatica, unspecified side: Secondary | ICD-10-CM | POA: Insufficient documentation

## 2014-07-03 DIAGNOSIS — I10 Essential (primary) hypertension: Secondary | ICD-10-CM | POA: Insufficient documentation

## 2014-07-03 DIAGNOSIS — E7800 Pure hypercholesterolemia, unspecified: Secondary | ICD-10-CM | POA: Insufficient documentation

## 2014-07-03 DIAGNOSIS — Z79899 Other long term (current) drug therapy: Secondary | ICD-10-CM | POA: Insufficient documentation

## 2014-07-03 DIAGNOSIS — Z87442 Personal history of urinary calculi: Secondary | ICD-10-CM | POA: Insufficient documentation

## 2014-07-03 DIAGNOSIS — Y9389 Activity, other specified: Secondary | ICD-10-CM | POA: Insufficient documentation

## 2014-07-03 DIAGNOSIS — S335XXA Sprain of ligaments of lumbar spine, initial encounter: Secondary | ICD-10-CM | POA: Insufficient documentation

## 2014-07-03 DIAGNOSIS — Z7982 Long term (current) use of aspirin: Secondary | ICD-10-CM | POA: Insufficient documentation

## 2014-07-03 MED ORDER — ONDANSETRON HCL 4 MG PO TABS
4.0000 mg | ORAL_TABLET | Freq: Once | ORAL | Status: AC
  Administered 2014-07-03: 4 mg via ORAL
  Filled 2014-07-03: qty 1

## 2014-07-03 MED ORDER — PREDNISONE 50 MG PO TABS
60.0000 mg | ORAL_TABLET | Freq: Once | ORAL | Status: AC
  Administered 2014-07-03: 60 mg via ORAL
  Filled 2014-07-03 (×2): qty 1

## 2014-07-03 MED ORDER — KETOROLAC TROMETHAMINE 10 MG PO TABS
10.0000 mg | ORAL_TABLET | Freq: Once | ORAL | Status: AC
  Administered 2014-07-03: 10 mg via ORAL
  Filled 2014-07-03: qty 1

## 2014-07-03 MED ORDER — METHOCARBAMOL 500 MG PO TABS: 500.0000 mg | ORAL_TABLET | Freq: Three times a day (TID) | ORAL | Status: DC

## 2014-07-03 MED ORDER — DEXAMETHASONE 6 MG PO TABS: ORAL_TABLET | ORAL | Status: DC

## 2014-07-03 MED ORDER — DIAZEPAM 5 MG PO TABS
5.0000 mg | ORAL_TABLET | Freq: Once | ORAL | Status: AC
  Administered 2014-07-03: 5 mg via ORAL
  Filled 2014-07-03: qty 1

## 2014-07-03 MED ORDER — DICLOFENAC SODIUM 75 MG PO TBEC: 75.0000 mg | DELAYED_RELEASE_TABLET | Freq: Two times a day (BID) | ORAL | Status: DC
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
## 2014-07-03 NOTE — Discharge Instructions (Signed)
Muscle Strain A muscle strain (pulled muscle) happens when a muscle is stretched beyond normal length. It happens when a sudden, violent force stretches your muscle too far. Usually, a few of the fibers in your muscle are torn. Muscle strain is common in athletes. Recovery usually takes 1-2 weeks. Complete healing takes 5-6 weeks.  HOME CARE   Follow the PRICE method of treatment to help your injury get better. Do this the first 2-3 days after the injury:  Protect. Protect the muscle to keep it from getting injured again.  Rest. Limit your activity and rest the injured body part.  Ice. Put ice in a plastic bag. Place a towel between your skin and the bag. Then, apply the ice and leave it on from 15-20 minutes each hour. After the third day, switch to moist heat packs.  Compression. Use a splint or elastic bandage on the injured area for comfort. Do not put it on too tightly.  Elevate. Keep the injured body part above the level of your heart.  Only take medicine as told by your doctor.  Warm up before doing exercise to prevent future muscle strains. GET HELP IF:   You have more pain or puffiness (swelling) in the injured area.  You feel numbness, tingling, or notice a loss of strength in the injured area. MAKE SURE YOU:   Understand these instructions.  Will watch your condition.  Will get help right away if you are not doing well or get worse. Document Released: 09/09/2008 Document Revised: 09/21/2013 Document Reviewed: 06/30/2013 [facility] Patient Information 2015 [place], [place]. This information is not intended to replace advice given to you by your health care provider. Make sure you discuss any questions you have with your health care provider.  Lumbosacral Strain Lumbosacral strain is a strain of any of the parts that make up your lumbosacral vertebrae. Your lumbosacral vertebrae are the bones that make up the lower third of your backbone. Your lumbosacral vertebrae are held  together by muscles and tough, fibrous tissue (ligaments).  CAUSES  A sudden blow to your back can cause lumbosacral strain. Also, anything that causes an excessive stretch of the muscles in the low back can cause this strain. This is typically seen when people exert themselves strenuously, fall, lift heavy objects, bend, or crouch repeatedly. RISK FACTORS  Physically demanding work.  Participation in pushing or pulling sports or sports that require a sudden twist of the back (tennis, golf, baseball).  Weight lifting.  Excessive lower back curvature.  Forward-tilted pelvis.  Weak back or abdominal muscles or both.  Tight hamstrings. SIGNS AND SYMPTOMS  Lumbosacral strain may cause pain in the area of your injury or pain that moves (radiates) down your leg.  DIAGNOSIS Your health care provider can often diagnose lumbosacral strain through a physical exam. In some cases, you may need tests such as X-ray exams.  TREATMENT  Treatment for your lower back injury depends on many factors that your clinician will have to evaluate. However, most treatment will include the use of anti-inflammatory medicines. HOME CARE INSTRUCTIONS   Avoid hard physical activities (tennis, racquetball, waterskiing) if you are not in proper physical condition for it. This may aggravate or create problems.  If you have a back problem, avoid sports requiring sudden body movements. Swimming and walking are generally safer activities.  Maintain good posture.  Maintain a healthy weight.  For acute conditions, you may put ice on the injured area.  Put ice in a plastic bag.  Place a towel between your skin and the bag.  Leave the ice on for 20 minutes, 2-3 times a day.  When the low back starts healing, stretching and strengthening exercises may be recommended. SEEK MEDICAL CARE IF:  Your back pain is getting worse.  You experience severe back pain not relieved with medicines. SEEK IMMEDIATE MEDICAL CARE  IF:   You have numbness, tingling, weakness, or problems with the use of your arms or legs.  There is a change in bowel or bladder control.  You have increasing pain in any area of the body, including your belly (abdomen).  You notice shortness of breath, dizziness, or feel faint.  You feel sick to your stomach (nauseous), are throwing up (vomiting), or become sweaty.  You notice discoloration of your toes or legs, or your feet get very cold. MAKE SURE YOU:   Understand these instructions.  Will watch your condition.  Will get help right away if you are not doing well or get worse. Document Released: 09/10/2005 Document Revised: 12/06/2013 Document Reviewed: 07/20/2013 [facility] Patient Information 2015 [place], [place]. This information is not intended to replace advice given to you by your health care provider. Make sure you discuss any questions you have with your health care provider.
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
## 2014-07-04 NOTE — ED Provider Notes (Signed)
Medical screening examination/treatment/procedure(s) were performed by non-physician practitioner and as supervising physician I was immediately available for consultation/collaboration.   EKG Interpretation None        Merryl Hacker, MD 07/04/14 1059

## 2014-10-19 ENCOUNTER — Emergency Department (HOSPITAL_COMMUNITY)
Admission: EM | Admit: 2014-10-19 | Discharge: 2014-10-19 | Disposition: A | Discharge status: Home / Self Care | Payer: 59 | Attending: Emergency Medicine | Admitting: Emergency Medicine

## 2014-10-19 ENCOUNTER — Encounter (HOSPITAL_COMMUNITY): Payer: Self-pay | Admitting: Emergency Medicine

## 2014-10-19 DIAGNOSIS — L02412 Cutaneous abscess of left axilla: Secondary | ICD-10-CM | POA: Insufficient documentation

## 2014-10-19 DIAGNOSIS — Z791 Long term (current) use of non-steroidal anti-inflammatories (NSAID): Secondary | ICD-10-CM | POA: Insufficient documentation

## 2014-10-19 DIAGNOSIS — Z87442 Personal history of urinary calculi: Secondary | ICD-10-CM | POA: Insufficient documentation

## 2014-10-19 DIAGNOSIS — L03112 Cellulitis of left axilla: Secondary | ICD-10-CM | POA: Insufficient documentation

## 2014-10-19 DIAGNOSIS — Z7982 Long term (current) use of aspirin: Secondary | ICD-10-CM | POA: Insufficient documentation

## 2014-10-19 DIAGNOSIS — I10 Essential (primary) hypertension: Secondary | ICD-10-CM | POA: Diagnosis not present

## 2014-10-19 DIAGNOSIS — Z79899 Other long term (current) drug therapy: Secondary | ICD-10-CM | POA: Insufficient documentation

## 2014-10-19 DIAGNOSIS — Z8739 Personal history of other diseases of the musculoskeletal system and connective tissue: Secondary | ICD-10-CM | POA: Diagnosis not present

## 2014-10-19 DIAGNOSIS — E780 Pure hypercholesterolemia: Secondary | ICD-10-CM | POA: Diagnosis not present

## 2014-10-19 DIAGNOSIS — Z792 Long term (current) use of antibiotics: Secondary | ICD-10-CM | POA: Diagnosis not present

## 2014-10-19 DIAGNOSIS — L0390 Cellulitis, unspecified: Secondary | ICD-10-CM

## 2014-10-19 DIAGNOSIS — L0291 Cutaneous abscess, unspecified: Secondary | ICD-10-CM

## 2014-10-19 DIAGNOSIS — L03115 Cellulitis of right lower limb: Secondary | ICD-10-CM | POA: Insufficient documentation

## 2014-10-19 DIAGNOSIS — Z720 Tobacco use: Secondary | ICD-10-CM | POA: Insufficient documentation

## 2014-10-19 DIAGNOSIS — L02415 Cutaneous abscess of right lower limb: Secondary | ICD-10-CM | POA: Insufficient documentation

## 2014-10-19 MED ORDER — SULFAMETHOXAZOLE-TRIMETHOPRIM 800-160 MG PO TABS
1.0000 | ORAL_TABLET | Freq: Once | ORAL | Status: AC
  Administered 2014-10-19: 1 via ORAL
  Filled 2014-10-19: qty 1

## 2014-10-19 MED ORDER — HYDROCODONE-ACETAMINOPHEN 5-325 MG PO TABS: 1.0000 | ORAL_TABLET | ORAL | Status: DC | PRN

## 2014-10-19 MED ORDER — HYDROCODONE-ACETAMINOPHEN 5-325 MG PO TABS
1.0000 | ORAL_TABLET | Freq: Once | ORAL | Status: AC
  Administered 2014-10-19: 1 via ORAL
  Filled 2014-10-19: qty 1

## 2014-10-19 MED ORDER — SULFAMETHOXAZOLE-TRIMETHOPRIM 800-160 MG PO TABS: 1.0000 | ORAL_TABLET | Freq: Two times a day (BID) | ORAL | Status: DC

## 2014-10-19 MED ORDER — LIDOCAINE HCL (PF) 1 % IJ SOLN
5.0000 mL | Freq: Once | INTRAMUSCULAR | Status: DC
  Filled 2014-10-19: qty 5

## 2014-10-19 NOTE — Discharge Instructions (Signed)
Abscess °Care After °An abscess (also called a boil or furuncle) is an infected area that contains a collection of pus. Signs and symptoms of an abscess include pain, tenderness, redness, or hardness, or you may feel a moveable soft area under your skin. An abscess can occur anywhere in the body. The infection may spread to surrounding tissues causing cellulitis. A cut (incision) by the surgeon was made over your abscess and the pus was drained out. Gauze may have been packed into the space to provide a drain that will allow the cavity to heal from the inside outwards. The boil may be painful for 5 to 7 days. Most people with a boil do not have high fevers. Your abscess, if seen early, may not have localized, and may not have been lanced. If not, another appointment may be required for this if it does not get better on its own or with medications. °HOME CARE INSTRUCTIONS  °· Only take over-the-counter or prescription medicines for pain, discomfort, or fever as directed by your caregiver. °· When you bathe, soak and then remove gauze or iodoform packs at least daily or as directed by your caregiver. You may then wash the wound gently with mild soapy water. Repack with gauze or do as your caregiver directs. °SEEK IMMEDIATE MEDICAL CARE IF:  °· You develop increased pain, swelling, redness, drainage, or bleeding in the wound site. °· You develop signs of generalized infection including muscle aches, chills, fever, or a general ill feeling. °· An oral temperature above 102° F (38.9° C) develops, not controlled by medication. °See your caregiver for a recheck if you develop any of the symptoms described above. If medications (antibiotics) were prescribed, take them as directed. °Document Released: 06/19/2005 Document Revised: 02/23/2012 Document Reviewed: 02/14/2008 °[employer]® Patient Information ©2015 [employer]. This information is not intended to replace advice given to you by your health care provider. Make sure  you discuss any questions you have with your health care provider. ° °Cellulitis °Cellulitis is an infection of the skin and the tissue beneath it. The infected area is usually red and tender. Cellulitis occurs most often in the arms and lower legs.  °CAUSES  °Cellulitis is caused by bacteria that enter the skin through cracks or cuts in the skin. The most common types of bacteria that cause cellulitis are staphylococci and streptococci. °SIGNS AND SYMPTOMS  °· Redness and warmth. °· Swelling. °· Tenderness or pain. °· Fever. °DIAGNOSIS  °Your health care provider can usually determine what is wrong based on a physical exam. Blood tests may also be done. °TREATMENT  °Treatment usually involves taking an antibiotic medicine. °HOME CARE INSTRUCTIONS  °· Take your antibiotic medicine as directed by your health care provider. Finish the antibiotic even if you start to feel better. °· Keep the infected arm or leg elevated to reduce swelling. °· Apply a warm cloth to the affected area up to 4 times per day to relieve pain. °· Take medicines only as directed by your health care provider. °· Keep all follow-up visits as directed by your health care provider. °SEEK MEDICAL CARE IF:  °· You notice red streaks coming from the infected area. °· Your red area gets larger or turns dark in color. °· Your bone or joint underneath the infected area becomes painful after the skin has healed. °· Your infection returns in the same area or another area. °· You notice a swollen bump in the infected area. °· You develop new symptoms. °· You have   a fever. °SEEK IMMEDIATE MEDICAL CARE IF:  °· You feel very sleepy. °· You develop vomiting or diarrhea. °· You have a general ill feeling (malaise) with muscle aches and pains. °MAKE SURE YOU:  °· Understand these instructions. °· Will watch your condition. °· Will get help right away if you are not doing well or get worse. °Document Released: 09/10/2005 Document Revised: 04/17/2014 Document  Reviewed: 02/16/2012 °[employer]® Patient Information ©2015 [employer]. This information is not intended to replace advice given to you by your health care provider. Make sure you discuss any questions you have with your health care provider. ° °

## 2014-10-19 NOTE — ED Notes (Signed)
Pt c/o "boil" to the left underarm and an insect bit to the shin of the right leg.

## 2014-10-20 NOTE — ED Provider Notes (Signed)
CSN: [id number]     Arrival date & time 10/19/14  2026 History   First MD Initiated Contact with Patient 10/19/14 2056     Chief Complaint  Patient presents with  . Abscess  . Insect Bite     (Consider location/radiation/quality/duration/timing/severity/associated sxs/prior Treatment) Patient is a 53 y.o. male presenting with abscess. The history is provided by the patient.  Abscess Location:  Shoulder/arm and leg Shoulder/arm abscess location:  L axilla Leg abscess location:  R lower leg Abscess quality: induration, painful and redness   Abscess quality: not draining   Red streaking: no   Duration:  1 week Progression:  Worsening Pain details:    Quality:  Pressure and sharp   Severity:  Moderate   Timing:  Constant   Progression:  Worsening Chronicity:  Recurrent (Has a history of infrequent abscesses, no known h/o mrsa.  last abcess involved right hand last year) Context: not diabetes, not immunosuppression, not injected drug use, not insect bite/sting and not skin injury   Relieved by:  Nothing Ineffective treatments:  Warm compresses Associated symptoms: no fever, no nausea and no vomiting   Risk factors: prior abscess   Risk factors: no hx of MRSA     Past Medical History  Diagnosis Date  . Hypertension   . High cholesterol   . Kidney stone   . Back pain   . Sciatica    Past Surgical History  Procedure Laterality Date  . Skin cancer excision     No family history on file. History  Substance Use Topics  . Smoking status: Current Every Day Smoker    Types: Cigarettes  . Smokeless tobacco: Not on file  . Alcohol Use: No    Review of Systems  Constitutional: Negative for fever and chills.  Respiratory: Negative.   Cardiovascular: Negative.   Gastrointestinal: Negative.  Negative for nausea and vomiting.  Skin:       Negative except as mentioned in HPI.    Neurological: Negative for numbness.      Allergies  Bee venom  Home Medications    Prior to Admission medications   Medication Sig Start Date End Date Taking? Authorizing Provider  aspirin EC 81 MG tablet Take 81 mg by mouth daily.    Historical Provider, MD  dexamethasone (DECADRON) 6 MG tablet 1 po bid with food 07/03/14   Lenox Ahr, PA-C  diclofenac (VOLTAREN) 75 MG EC tablet Take 1 tablet (75 mg total) by mouth 2 (two) times daily. 07/03/14   Lenox Ahr, PA-C  HYDROcodone-acetaminophen (NORCO/VICODIN) 5-325 MG per tablet Take 1 tablet by mouth every 4 (four) hours as needed. 10/19/14   Evalee Jefferson, PA-C  lisinopril (PRINIVIL,ZESTRIL) 10 MG tablet Take 10 mg by mouth every morning.  11/14/13   Historical Provider, MD  methocarbamol (ROBAXIN) 500 MG tablet Take 1 tablet (500 mg total) by mouth 3 (three) times daily. 07/03/14   Lenox Ahr, PA-C  oxyCODONE-acetaminophen (PERCOCET) 10-325 MG per tablet Take 1 tablet by mouth every 6 (six) hours as needed for pain.  10/18/13   Historical Provider, MD  simvastatin (ZOCOR) 10 MG tablet Take 10 mg by mouth at bedtime.    Historical Provider, MD  sulfamethoxazole-trimethoprim (SEPTRA DS) 800-160 MG per tablet Take 1 tablet by mouth every 12 (twelve) hours. 10/19/14   Evalee Jefferson, PA-C   BP 97/73 mmHg  Pulse 102  Temp(Src) 98.4 F (36.9 C) (Oral)  Resp 17  Ht 5\' 5"  (1.651 m)  Wt  185 lb (83.915 kg)  BMI 30.79 kg/m2  SpO2 97% Physical Exam  Constitutional: He appears well-developed and well-nourished. No distress.  HENT:  Head: Normocephalic.  Neck: Neck supple.  Cardiovascular: Normal rate.   Pulmonary/Chest: Effort normal. He has no wheezes.  Musculoskeletal: Normal range of motion. He exhibits no edema.  Skin:  1 cm area of raised, erythematous induration left axilla, no drainage.  No red streaking.  Punctate scab right lateral lower leg with surrounding 2 cm area of erythema without red streaking. No induration, no drainage, no fluctuance.    ED Course  Procedures (including critical care time)  INCISION  AND DRAINAGE  Left axilla Performed by: Evalee Jefferson Consent: Verbal consent obtained. Risks and benefits: risks, benefits and alternatives were discussed Type: abscess  Body area: left axilla  Anesthesia: local infiltration  Incision was made with a scalpel.  Local anesthetic: lidocaine 1% without epinephrine  Anesthetic total: 3 ml  Complexity: complex Blunt dissection to break up loculations  Drainage: purulent  Drainage amount: moderate  Packing material: no packing  Patient tolerance: Patient tolerated the procedure well with no immediate complications.   INCISION AND DRAINAGE  Right leg Performed by: Evalee Jefferson Consent: Verbal consent obtained. Risks and benefits: risks, benefits and alternatives were discussed Type: abscess  Body area: right leg Anesthesia: local infiltration  Incision was made with a scalpel.  Local anesthetic: lidocaine 1% w/o epinephrine  Anesthetic total: 2 ml  Complexity: complex Blunt dissection to break up loculations  Drainage: purulent  Drainage amount: scant  Packing material: na  Patient tolerance: Patient tolerated the procedure well with no immediate complications.      Labs Review Labs Reviewed  CULTURE, ROUTINE-ABSCESS    Imaging Review No results found.   EKG Interpretation None      MDM   Final diagnoses:  Abscess and cellulitis    Wound culture obtained.  Pt placed on bactrim, prescribed hydrocodone.  Encouraged warm compresses/warm soaks. Prn f/u with pcp if sx are not resolving with todays tx.  The patient appears reasonably screened and/or stabilized for discharge and I doubt any other medical condition or other [facility] requiring further screening, evaluation, or treatment in the ED at this time prior to discharge.     Evalee Jefferson, PA-C 10/20/14 [place], MD 10/23/14 [phone]

## 2014-10-23 LAB — CULTURE, ROUTINE-ABSCESS: SPECIAL REQUESTS: NORMAL

## 2014-10-24 ENCOUNTER — Telehealth (HOSPITAL_BASED_OUTPATIENT_CLINIC_OR_DEPARTMENT_OTHER): Payer: Self-pay | Admitting: Emergency Medicine

## 2014-10-24 NOTE — Progress Notes (Signed)
ED Antimicrobial Stewardship Positive Culture Follow Up   Glen Livingston is an 53 y.o. male who presented to [facility] on 10/19/2014 with a chief complaint of  Chief Complaint  Patient presents with  . Abscess  . Insect Bite    Recent Results (from the past 720 hour(s))  Culture, routine-abscess     Status: None   Collection Time: 10/19/14  9:30 PM  Result Value Ref Range Status   Specimen Description SKIN  Final   Special Requests Normal  Final   Gram Stain   Final    FEW WBC PRESENT, PREDOMINANTLY PMN NO SQUAMOUS EPITHELIAL CELLS SEEN FEW GRAM POSITIVE COCCI IN CLUSTERS Performed at [facility]    Culture   Final    FEW STAPHYLOCOCCUS AUREUS Note: RIFAMPIN AND GENTAMICIN SHOULD NOT BE USED AS SINGLE DRUGS FOR TREATMENT OF STAPH INFECTIONS. Performed at [facility]    Report Status 10/23/2014 FINAL  Final   Organism ID, Bacteria STAPHYLOCOCCUS AUREUS  Final      Susceptibility   Staphylococcus aureus - MIC*    CLINDAMYCIN <=0.25 SENSITIVE Sensitive     ERYTHROMYCIN <=0.25 SENSITIVE Sensitive     GENTAMICIN <=0.5 SENSITIVE Sensitive     LEVOFLOXACIN <=0.12 SENSITIVE Sensitive     OXACILLIN <=0.25 SENSITIVE Sensitive     PENICILLIN >=0.5 RESISTANT Resistant     RIFAMPIN <=0.5 SENSITIVE Sensitive     TRIMETH/SULFA >=320 RESISTANT Resistant     VANCOMYCIN 1 SENSITIVE Sensitive     TETRACYCLINE <=1 SENSITIVE Sensitive     MOXIFLOXACIN <=0.25 SENSITIVE Sensitive     * FEW STAPHYLOCOCCUS AUREUS    [x]  Treated with Bactrim, organism resistant to prescribed antimicrobial  New antibiotic prescription: Stop Bactrim. Start Keflex 500mg  three times daily for 5 days  ED Provider: Alvina Chou, PA   Elicia Lamp P 10/24/2014, 10:04 AM Infectious Diseases Pharmacist Phone# [phone]

## 2014-10-24 NOTE — Telephone Encounter (Signed)
Post ED Visit - Positive Culture Follow-up: Successful Patient Follow-Up  Culture assessed and recommendations reviewed by: []  Wes Hayden, Pharm.D., BCPS [x]  Heide Guile, Pharm.D., BCPS []  Alycia Rossetti, Pharm.D., BCPS []  [place], Pharm.D., BCPS, AAHIVP []  Legrand Como, Pharm.D., BCPS, AAHIVP []  Hassie Bruce, Pharm.D. []  Milus Glazier, Pharm.D.  Positive wound abcessculture  []  Patient discharged without antimicrobial prescription and treatment is now indicated [x]  Organism is resistant to prescribed ED discharge antimicrobial []  Patient with positive blood cultures  Changes discussed with ED provider:   [facility]      Stop bactrim and start keflex 500mg  po tid x 5 days Called to   [facility] patient, date 10/24/14  lvm for callback with EC, Glen Livingston, Glen Livingston 10/24/2014, 11:22 AM

## 2014-10-25 ENCOUNTER — Telehealth: Payer: Self-pay | Admitting: Emergency Medicine

## 2014-10-26 ENCOUNTER — Telehealth (HOSPITAL_COMMUNITY): Payer: Self-pay

## 2014-10-26 NOTE — ED Notes (Signed)
Unable to reach by telephone. Letter sent to address on record.  

## 2014-11-06 ENCOUNTER — Telehealth (HOSPITAL_BASED_OUTPATIENT_CLINIC_OR_DEPARTMENT_OTHER): Payer: Self-pay | Admitting: *Deleted

## 2014-11-28 ENCOUNTER — Telehealth: Payer: Self-pay

## 2014-11-28 NOTE — Telephone Encounter (Signed)
Appropriate.

## 2014-11-28 NOTE — Telephone Encounter (Signed)
Gastroenterology Pre-Procedure Review  Request Date: Requesting Physician: [place] Doctor  PATIENT REVIEW QUESTIONS: The patient responded to the following health history questions as indicated:    1. Diabetes Melitis: NO 2. Joint replacements in the past 12 months: NO 3. Major health problems in the past 3 months: NO 4. Has an artificial valve or MVP: NO 5. Has a defibrillator: NO 6. Has been advised in past to take antibiotics in advance of a procedure like teeth cleaning: NO 7. Family history colon cancer: NO 8. Alcohol: YES 1-2 beers a month  9. No problems at this time     MEDICATIONS & ALLERGIES:    Patient reports the following regarding taking any blood thinners:   Plavix? NO Aspirin? YES Coumadin? NO  Patient confirms/reports the following medications:  Current Outpatient Prescriptions  Medication Sig Dispense Refill  . aspirin EC 81 MG tablet Take 81 mg by mouth daily.    Marland Kitchen dexamethasone (DECADRON) 6 MG tablet 1 po bid with food 12 tablet 0  . diclofenac (VOLTAREN) 75 MG EC tablet Take 1 tablet (75 mg total) by mouth 2 (two) times daily. 14 tablet 0  . HYDROcodone-acetaminophen (NORCO/VICODIN) 5-325 MG per tablet Take 1 tablet by mouth every 4 (four) hours as needed. 15 tablet 0  . lisinopril (PRINIVIL,ZESTRIL) 10 MG tablet Take 10 mg by mouth every morning.     . methocarbamol (ROBAXIN) 500 MG tablet Take 1 tablet (500 mg total) by mouth 3 (three) times daily. 21 tablet 0  . oxyCODONE-acetaminophen (PERCOCET) 10-325 MG per tablet Take 1 tablet by mouth every 6 (six) hours as needed for pain.     . simvastatin (ZOCOR) 10 MG tablet Take 10 mg by mouth at bedtime.    . sulfamethoxazole-trimethoprim (SEPTRA DS) 800-160 MG per tablet Take 1 tablet by mouth every 12 (twelve) hours. 20 tablet 0   No current facility-administered medications for this visit.    Patient confirms/reports the following allergies:  Allergies  Allergen Reactions  . Bee Venom Swelling    No  orders of the defined types were placed in this encounter.    AUTHORIZATION INFORMATION Primary Insurance: [facility]  ID #: [id number]  Group #: [id number] Pre-Cert / Josem Kaufmann required:  Pre-Cert / Josem Kaufmann #:    SCHEDULE INFORMATION: Procedure has been scheduled as follows:  Date:  Time:  Location:   This Gastroenterology Pre-Precedure Review Form is being routed to the following provider(s):   Pt does not matter who does his TCS

## 2014-11-28 NOTE — Telephone Encounter (Signed)
Patient received letter from DS to set up colonoscopy. [phone]

## 2014-11-29 ENCOUNTER — Other Ambulatory Visit: Payer: Self-pay

## 2014-11-29 DIAGNOSIS — Z139 Encounter for screening, unspecified: Secondary | ICD-10-CM

## 2014-11-29 MED ORDER — PEG-KCL-NACL-NASULF-NA ASC-C 100 G PO SOLR: 1.0000 | Freq: Once | ORAL | Status: AC

## 2014-11-29 NOTE — Telephone Encounter (Signed)
Pt is set up for TCS on 12/27/14 @ 10:45 with RMR

## 2014-11-29 NOTE — Telephone Encounter (Signed)
PA number for TCS is [phone]

## 2014-12-27 ENCOUNTER — Encounter (HOSPITAL_COMMUNITY)
Admission: RE | Disposition: A | Discharge status: Home / Self Care | Payer: Self-pay | Source: Ambulatory Visit | Attending: Internal Medicine

## 2014-12-27 ENCOUNTER — Ambulatory Visit (HOSPITAL_COMMUNITY)
Admission: RE | Admit: 2014-12-27 | Discharge: 2014-12-27 | Disposition: A | Discharge status: Home / Self Care | Payer: 59 | Source: Ambulatory Visit | Attending: Internal Medicine | Admitting: Internal Medicine

## 2014-12-27 ENCOUNTER — Encounter (HOSPITAL_COMMUNITY): Payer: Self-pay | Admitting: *Deleted

## 2014-12-27 DIAGNOSIS — K635 Polyp of colon: Secondary | ICD-10-CM | POA: Diagnosis not present

## 2014-12-27 DIAGNOSIS — Z139 Encounter for screening, unspecified: Secondary | ICD-10-CM

## 2014-12-27 DIAGNOSIS — D123 Benign neoplasm of transverse colon: Secondary | ICD-10-CM | POA: Diagnosis not present

## 2014-12-27 DIAGNOSIS — Z1211 Encounter for screening for malignant neoplasm of colon: Secondary | ICD-10-CM

## 2014-12-27 DIAGNOSIS — Z86010 Personal history of colonic polyps: Secondary | ICD-10-CM | POA: Insufficient documentation

## 2014-12-27 HISTORY — PX: COLONOSCOPY: SHX5424

## 2014-12-27 SURGERY — COLONOSCOPY
Anesthesia: Moderate Sedation

## 2014-12-27 MED ORDER — MEPERIDINE HCL 100 MG/ML IJ SOLN
INTRAMUSCULAR | Status: DC | PRN
  Administered 2014-12-27 (×2): 25 mg via INTRAVENOUS
  Administered 2014-12-27: 50 mg via INTRAVENOUS

## 2014-12-27 MED ORDER — SODIUM CHLORIDE 0.9 % IV SOLN
INTRAVENOUS | Status: DC
  Administered 2014-12-27: 1000 mL via INTRAVENOUS

## 2014-12-27 MED ORDER — MIDAZOLAM HCL 5 MG/5ML IJ SOLN
INTRAMUSCULAR | Status: AC
  Filled 2014-12-27: qty 10

## 2014-12-27 MED ORDER — MEPERIDINE HCL 100 MG/ML IJ SOLN
INTRAMUSCULAR | Status: AC
  Filled 2014-12-27: qty 2

## 2014-12-27 MED ORDER — MIDAZOLAM HCL 5 MG/5ML IJ SOLN
INTRAMUSCULAR | Status: DC | PRN
  Administered 2014-12-27: 2 mg via INTRAVENOUS
  Administered 2014-12-27: 1 mg via INTRAVENOUS
  Administered 2014-12-27: 2 mg via INTRAVENOUS
  Administered 2014-12-27: 1 mg via INTRAVENOUS
  Administered 2014-12-27: 2 mg via INTRAVENOUS

## 2014-12-27 MED ORDER — ONDANSETRON HCL 4 MG/2ML IJ SOLN
INTRAMUSCULAR | Status: AC
  Filled 2014-12-27: qty 2

## 2014-12-27 MED ORDER — ONDANSETRON HCL 4 MG/2ML IJ SOLN
INTRAMUSCULAR | Status: DC | PRN
  Administered 2014-12-27: 4 mg via INTRAVENOUS

## 2014-12-27 MED ORDER — STERILE WATER FOR IRRIGATION IR SOLN
Status: DC | PRN
  Administered 2014-12-27: 11:00:00

## 2014-12-27 NOTE — Op Note (Signed)
[facility] [address] [place], [address]   COLONOSCOPY PROCEDURE REPORT  PATIENT: Glen Livingston, Glen Livingston  MR#: [id number] BIRTHDATE: [birth year] , 24  yrs. old GENDER: male ENDOSCOPIST: R.  Garfield Cornea, MD FACP [facility] REFERRED BJ:SEGBT Gerarda Fraction, M.D. PROCEDURE DATE:  2014-12-29 PROCEDURE:   Colonoscopy with snare polypectomy and Colonoscopy with biopsy INDICATIONS:First ever average risk screening colonoscopy. MEDICATIONS: Versed 8 mg IV and Demerol 100 mg IV in divided doses. Zofran 4 mg IV. ASA CLASS:       Class II  CONSENT: The risks, benefits, alternatives and imponderables including but not limited to bleeding, perforation as well as the possibility of a missed lesion have been reviewed.  The potential for biopsy, lesion removal, etc. have also been discussed. Questions have been answered.  All parties agreeable.  Please see the history and physical in the medical record for more information.  DESCRIPTION OF PROCEDURE:   After the risks benefits and alternatives of the procedure were thoroughly explained, informed consent was obtained.  The digital rectal exam revealed no abnormalities of the rectum.   The EC-[id number] ([id number])  endoscope was introduced through the anus and advanced to the cecum, which was identified by both the appendix and ileocecal valve. No adverse events experienced.   The quality of the prep was adequate.  The instrument was then slowly withdrawn as the colon was fully examined.      COLON FINDINGS: Normal rectum.  (1) diminutive polyp in the base of the cecum the patient had (1) 6 mm polyp at the hepatic flexure and a 7 mm polyp in the mid descending segment.  The patient did have sigmoid submucosal petechiae and some polypoid mucosa diffusely in this segment.  However, the remainder of the colonic mucosa appeared normal.  The above-mentioned polyps were cold biopsied, cold snared and hot snare removed, respectively.  Retroflexion  was performed. .  Withdrawal time=16 minutes 0 seconds.  The scope was withdrawn and the procedure completed. COMPLICATIONS: There were no immediate complications.  ENDOSCOPIC IMPRESSION: Multiple colonic polyps?"removed as described above.  RECOMMENDATIONS: Follow-up on pathology.  eSigned:  R. Garfield Cornea, MD Rosalita Chessman [facility] 2014/12/29 11:52 AM   cc:  CPT CODES: ICD CODES:  The ICD and CPT codes recommended by this software are interpretations from the data that the clinical staff has captured with the software.  The verification of the translation of this report to the ICD and CPT codes and modifiers is the sole responsibility of the health care institution and practicing physician where this report was generated.  [employer] will not be held responsible for the validity of the ICD and CPT codes included on this report.  AMA assumes no liability for data contained or not contained herein. CPT is a Designer, television/film set of the [employer].  PATIENT NAME:  [name], [name] MR#: [id number]

## 2014-12-27 NOTE — Discharge Instructions (Signed)
°Colonoscopy °Discharge Instructions ° °Read the instructions outlined below and refer to this sheet in the next few weeks. These discharge instructions provide you with general information on caring for yourself after you leave the hospital. Your doctor may also give you specific instructions. While your treatment has been planned according to the most current medical practices available, unavoidable complications occasionally occur. If you have any problems or questions after discharge, call Dr. Rourk at [phone]. °ACTIVITY °· You may resume your regular activity, but move at a slower pace for the next 24 hours.  °· Take frequent rest periods for the next 24 hours.  °· Walking will help get rid of the air and reduce the bloated feeling in your belly (abdomen).  °· No driving for 24 hours (because of the medicine (anesthesia) used during the test).   °· Do not sign any important legal documents or operate any machinery for 24 hours (because of the anesthesia used during the test).  °NUTRITION °· Drink plenty of fluids.  °· You may resume your normal diet as instructed by your doctor.  °· Begin with a light meal and progress to your normal diet. Heavy or fried foods are harder to digest and may make you feel sick to your stomach (nauseated).  °· Avoid alcoholic beverages for 24 hours or as instructed.  °MEDICATIONS °· You may resume your normal medications unless your doctor tells you otherwise.  °WHAT YOU CAN EXPECT TODAY °· Some feelings of bloating in the abdomen.  °· Passage of more gas than usual.  °· Spotting of blood in your stool or on the toilet paper.  °IF YOU HAD POLYPS REMOVED DURING THE COLONOSCOPY: °· No aspirin products for 7 days or as instructed.  °· No alcohol for 7 days or as instructed.  °· Eat a soft diet for the next 24 hours.  °FINDING OUT THE RESULTS OF YOUR TEST °Not all test results are available during your visit. If your test results are not back during the visit, make an appointment  with your caregiver to find out the results. Do not assume everything is normal if you have not heard from your caregiver or the medical facility. It is important for you to follow up on all of your test results.  °SEEK IMMEDIATE MEDICAL ATTENTION IF: °· You have more than a spotting of blood in your stool.  °· Your belly is swollen (abdominal distention).  °· You are nauseated or vomiting.  °· You have a temperature over 101.  °· You have abdominal pain or discomfort that is severe or gets worse throughout the day.  ° ° °Polyp information provided ° °Further recommendations to follow pending review of pathology report ° °Colon Polyps °Polyps are lumps of extra tissue growing inside the body. Polyps can grow in the large intestine (colon). Most colon polyps are noncancerous (benign). However, some colon polyps can become cancerous over time. Polyps that are larger than a pea may be harmful. To be safe, caregivers remove and test all polyps. °CAUSES  °Polyps form when mutations in the genes cause your cells to grow and divide even though no more tissue is needed. °RISK FACTORS °There are a number of risk factors that can increase your chances of getting colon polyps. They include: °· Being older than 50 years. °· Family history of colon polyps or colon cancer. °· Long-term colon diseases, such as colitis or Crohn disease. °· Being overweight. °· Smoking. °· Being inactive. °· Drinking too much alcohol. °SYMPTOMS  °  Most small polyps do not cause symptoms. If symptoms are present, they may include: °· Blood in the stool. The stool may look dark red or black. °· Constipation or diarrhea that lasts longer than 1 week. °DIAGNOSIS °People often do not know they have polyps until their caregiver finds them during a regular checkup. Your caregiver can use 4 tests to check for polyps: °· Digital rectal exam. The caregiver wears gloves and feels inside the rectum. This test would find polyps only in the rectum. °· Barium  enema. The caregiver puts a liquid called barium into your rectum before taking X-rays of your colon. Barium makes your colon look white. Polyps are dark, so they are easy to see in the X-ray pictures. °· Sigmoidoscopy. A thin, flexible tube (sigmoidoscope) is placed into your rectum. The sigmoidoscope has a light and tiny camera in it. The caregiver uses the sigmoidoscope to look at the last third of your colon. °· Colonoscopy. This test is like sigmoidoscopy, but the caregiver looks at the entire colon. This is the most common method for finding and removing polyps. °TREATMENT  °Any polyps will be removed during a sigmoidoscopy or colonoscopy. The polyps are then tested for cancer. °PREVENTION  °To help lower your risk of getting more colon polyps: °· Eat plenty of fruits and vegetables. Avoid eating fatty foods. °· Do not smoke. °· Avoid drinking alcohol. °· Exercise every day. °· Lose weight if recommended by your caregiver. °· Eat plenty of calcium and folate. Foods that are rich in calcium include milk, cheese, and broccoli. Foods that are rich in folate include chickpeas, kidney beans, and spinach. °HOME CARE INSTRUCTIONS °Keep all follow-up appointments as directed by your caregiver. You may need periodic exams to check for polyps. °SEEK MEDICAL CARE IF: °You notice bleeding during a bowel movement. °Document Released: 08/27/2004 Document Revised: 02/23/2012 Document Reviewed: 02/10/2012 °[employer]® Patient Information ©2015 [employer]. This information is not intended to replace advice given to you by your health care provider. Make sure you discuss any questions you have with your health care provider. ° °

## 2014-12-27 NOTE — H&P (Signed)
_0 @   Primary Care Physician:  Glo Herring., MD Primary Gastroenterologist:  Dr. Gala Romney  Pre-Procedure History & Physical: HPI:  Glen Livingston is a 54 y.o. male is here for a screening colonoscopy. No bowel symptoms. No family history of colon cancer. No prior colonoscopy.  Past Medical History  Diagnosis Date  . Hypertension   . High cholesterol   . Kidney stone   . Back pain   . Sciatica     Past Surgical History  Procedure Laterality Date  . Skin cancer excision      Prior to Admission medications   Medication Sig Start Date End Date Taking? Authorizing Provider  aspirin 325 MG tablet Take 325 mg by mouth daily.   Yes Historical Provider, MD  lisinopril (PRINIVIL,ZESTRIL) 5 MG tablet Take 1 tablet by mouth daily. 12/13/14  Yes Historical Provider, MD  methocarbamol (ROBAXIN) 500 MG tablet Take 1 tablet (500 mg total) by mouth 3 (three) times daily. 07/03/14  Yes Lenox Ahr, PA-C  oxyCODONE-acetaminophen (PERCOCET) 10-325 MG per tablet Take 1 tablet by mouth every 6 (six) hours as needed for pain.  10/18/13  Yes Historical Provider, MD  peg 3350 powder (MOVIPREP) 100 G SOLR Take 1 kit (200 g total) by mouth once. 11/29/14 12/29/14 Yes Orvil Feil, NP  simvastatin (ZOCOR) 20 MG tablet Take 1 tablet by mouth daily. 11/16/14  Yes Historical Provider, MD  dexamethasone (DECADRON) 6 MG tablet 1 po bid with food Patient not taking: Reported on 11/28/2014 07/03/14   Lenox Ahr, PA-C  diclofenac (VOLTAREN) 75 MG EC tablet Take 1 tablet (75 mg total) by mouth 2 (two) times daily. Patient not taking: Reported on 11/28/2014 07/03/14   Lenox Ahr, PA-C  HYDROcodone-acetaminophen (NORCO/VICODIN) 5-325 MG per tablet Take 1 tablet by mouth every 4 (four) hours as needed. Patient not taking: Reported on 11/28/2014 10/19/14   Evalee Jefferson, PA-C  sulfamethoxazole-trimethoprim (SEPTRA DS) 800-160 MG per tablet Take 1 tablet by mouth every 12 (twelve) hours. Patient not taking:  Reported on 11/28/2014 10/19/14   Evalee Jefferson, PA-C    Allergies as of 11/29/2014 - Review Complete 10/19/2014  Allergen Reaction Noted  . Bee venom Swelling 09/02/2011    History reviewed. No pertinent family history.  History   Social History  . Marital Status: Married    Spouse Name: N/A    Number of Children: N/A  . Years of Education: N/A   Occupational History  . Not on file.   Social History Main Topics  . Smoking status: Current Every Day Smoker    Types: Cigarettes  . Smokeless tobacco: Not on file  . Alcohol Use: No  . Drug Use: No  . Sexual Activity: Not on file   Other Topics Concern  . Not on file   Social History Narrative    Review of Systems: See HPI, otherwise negative ROS  Physical Exam: BP 119/85 mmHg  Pulse 80  Temp(Src) 97.9 F (36.6 C) (Oral)  Resp 18  Ht _1  (1.626 m)  Wt 156 lb (70.761 kg)  BMI 26.76 kg/m2  SpO2 99% General:   Alert,  Well-developed, well-nourished, pleasant and cooperative in NAD Head:  Normocephalic and atraumatic. Eyes:  Sclera clear, no icterus.   Conjunctiva pink. Ears:  Normal auditory acuity. Nose:  No deformity, discharge,  or lesions. Mouth:  No deformity or lesions, dentition normal. Neck:  Supple; no masses or thyromegaly. Lungs:  Clear throughout to auscultation.   No  wheezes, crackles, or rhonchi. No acute distress. Heart:  Regular rate and rhythm; no murmurs, clicks, rubs,  or gallops. Abdomen:  Soft, nontender and nondistended. No masses, hepatosplenomegaly or hernias noted. Normal bowel sounds, without guarding, and without rebound.   Msk:  Symmetrical without gross deformities. Normal posture. Pulses:  Normal pulses noted. Extremities:  Without clubbing or edema. Neurologic:  Alert and  oriented x4;  grossly normal neurologically. Skin:  Intact without significant lesions or rashes. Cervical Nodes:  No significant cervical adenopathy. Psych:  Alert and cooperative. Normal mood and  affect.  Impression/Plan: Glen Livingston is now here to undergo a screening colonoscopy.  First ever average her screening examination. Risks, benefits, limitations, imponderables and alternatives regarding colonoscopy have been reviewed with the patient. Questions have been answered. All parties agreeable.     Notice:  This dictation was prepared with Dragon dictation along with smaller phrase technology. Any transcriptional errors that result from this process are unintentional and may not be corrected upon review. The risks, benefits, limitations, alternatives and imponderables have been reviewed with the patient. Questions have been answered. All parties are agreeable.

## 2014-12-28 ENCOUNTER — Encounter: Payer: Self-pay | Admitting: Internal Medicine

## 2014-12-28 ENCOUNTER — Encounter (HOSPITAL_COMMUNITY): Payer: Self-pay | Admitting: Internal Medicine

## 2015-05-17 ENCOUNTER — Encounter (HOSPITAL_COMMUNITY): Payer: Self-pay

## 2015-05-17 ENCOUNTER — Emergency Department (HOSPITAL_COMMUNITY)
Admission: EM | Admit: 2015-05-17 | Discharge: 2015-05-17 | Disposition: A | Discharge status: Home / Self Care | Payer: 59 | Attending: Emergency Medicine | Admitting: Emergency Medicine

## 2015-05-17 DIAGNOSIS — L02414 Cutaneous abscess of left upper limb: Secondary | ICD-10-CM | POA: Diagnosis not present

## 2015-05-17 DIAGNOSIS — Z87442 Personal history of urinary calculi: Secondary | ICD-10-CM | POA: Insufficient documentation

## 2015-05-17 DIAGNOSIS — Z791 Long term (current) use of non-steroidal anti-inflammatories (NSAID): Secondary | ICD-10-CM | POA: Diagnosis not present

## 2015-05-17 DIAGNOSIS — I10 Essential (primary) hypertension: Secondary | ICD-10-CM | POA: Insufficient documentation

## 2015-05-17 DIAGNOSIS — Z79899 Other long term (current) drug therapy: Secondary | ICD-10-CM | POA: Insufficient documentation

## 2015-05-17 DIAGNOSIS — E780 Pure hypercholesterolemia: Secondary | ICD-10-CM | POA: Insufficient documentation

## 2015-05-17 DIAGNOSIS — Z720 Tobacco use: Secondary | ICD-10-CM | POA: Insufficient documentation

## 2015-05-17 DIAGNOSIS — Z7982 Long term (current) use of aspirin: Secondary | ICD-10-CM | POA: Diagnosis not present

## 2015-05-17 DIAGNOSIS — Z8739 Personal history of other diseases of the musculoskeletal system and connective tissue: Secondary | ICD-10-CM | POA: Diagnosis not present

## 2015-05-17 DIAGNOSIS — L0291 Cutaneous abscess, unspecified: Secondary | ICD-10-CM

## 2015-05-17 MED ORDER — CEPHALEXIN 500 MG PO CAPS: 500.0000 mg | ORAL_CAPSULE | Freq: Four times a day (QID) | ORAL | Status: DC

## 2015-05-17 MED ORDER — HYDROCODONE-ACETAMINOPHEN 5-325 MG PO TABS: 1.0000 | ORAL_TABLET | ORAL | Status: DC | PRN

## 2015-05-17 MED ORDER — LIDOCAINE HCL (PF) 1 % IJ SOLN
INTRAMUSCULAR | Status: AC
  Administered 2015-05-17: 19:00:00
  Filled 2015-05-17: qty 5

## 2015-05-17 MED ORDER — BACITRACIN ZINC 500 UNIT/GM EX OINT
TOPICAL_OINTMENT | CUTANEOUS | Status: AC
  Administered 2015-05-17: 1
  Filled 2015-05-17: qty 0.9

## 2015-05-17 NOTE — ED Notes (Signed)
Abscess to lt forearm for 1week.

## 2015-05-17 NOTE — ED Notes (Signed)
Pt reports abscess to left arm x 1 week.

## 2015-05-17 NOTE — Discharge Instructions (Signed)

## 2015-05-17 NOTE — ED Provider Notes (Signed)
CSN: [id number]     Arrival date & time 05/17/15  1701 History   First MD Initiated Contact with Patient 05/17/15 1810     Chief Complaint  Patient presents with  . Abscess     (Consider location/radiation/quality/duration/timing/severity/associated sxs/prior Treatment) Patient is a 54 y.o. male presenting with abscess. The history is provided by the patient.  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 Relieved by:  Nothing Worsened by:  Nothing tried Ineffective treatments:  Warm compresses Associated symptoms: no fever, no nausea and no vomiting   Risk factors: prior abscess   Risk factors: no hx of MRSA     Past Medical History  Diagnosis Date  . Hypertension   . High cholesterol   . Kidney stone   . Back pain   . Sciatica    Past Surgical History  Procedure Laterality Date  . Skin cancer excision    . Colonoscopy N/A 12/27/2014    Procedure: COLONOSCOPY;  Surgeon: Daneil Dolin, MD;  Location: [facility];  Service: Endoscopy;  Laterality: N/A;  1045am   No family history on file. History  Substance Use Topics  . Smoking status: Current Every Day Smoker    Types: Cigarettes  . Smokeless tobacco: Not on file  . Alcohol Use: No    Review of Systems  Constitutional: Negative for fever and chills.  Respiratory: Negative for shortness of breath and wheezing.   Gastrointestinal: Negative for nausea and vomiting.  Skin: Positive for wound.  Neurological: Negative for numbness.       Allergies  Bee venom  Home Medications   Prior to Admission medications   Medication Sig Start Date End Date Taking? Authorizing Provider  aspirin 325 MG tablet Take 325 mg by mouth daily.   Yes Historical Provider, MD  lisinopril (PRINIVIL,ZESTRIL) 5 MG tablet Take 1 tablet by mouth 2 (two) times daily.  12/13/14  Yes Historical Provider, MD  methocarbamol (ROBAXIN) 500 MG tablet Take 1 tablet (500 mg total) by mouth 3 (three) times daily. Patient taking differently: Take 500 mg by mouth every 8 (eight) hours as needed for muscle spasms.  07/03/14  Yes Lily Kocher, PA-C  oxyCODONE-acetaminophen (PERCOCET) 10-325 MG per tablet Take 1 tablet by mouth 3 (three) times daily as needed. For pain 04/13/15  Yes Historical Provider, MD  simvastatin (ZOCOR) 20 MG tablet Take 1 tablet by mouth at bedtime.  11/16/14  Yes Historical Provider, MD  cephALEXin (KEFLEX) 500 MG capsule Take 1 capsule (500 mg total) by mouth 4 (four) times daily. 05/17/15   Evalee Jefferson, PA-C  dexamethasone (DECADRON) 6 MG tablet 1 po bid with food Patient not taking: Reported on 11/28/2014 07/03/14   Lily Kocher, PA-C  diclofenac (VOLTAREN) 75 MG EC tablet Take 1 tablet (75 mg total) by mouth 2 (two) times daily. Patient not taking: Reported on 11/28/2014 07/03/14   Lily Kocher, PA-C  HYDROcodone-acetaminophen (NORCO/VICODIN) 5-325 MG per tablet Take 1 tablet by mouth every 4 (four) hours as needed.  05/17/15   Evalee Jefferson, PA-C  sulfamethoxazole-trimethoprim (SEPTRA DS) 800-160 MG per tablet Take 1 tablet by mouth every 12 (twelve) hours. Patient not taking: Reported on 11/28/2014 10/19/14   Evalee Jefferson, PA-C   BP 126/83 mmHg  Pulse 84  Temp(Src) 98.7 F (37.1 C) (Oral)  Resp 18  Ht 5\' 4"  (1.626 m)  Wt 152 lb (68.947 kg)  BMI 26.08 kg/m2  SpO2 100% Physical Exam  Constitutional: He appears well-developed and well-nourished. No distress.  HENT:  Head: Normocephalic.  Neck: Neck supple.  Cardiovascular: Normal  rate.   Pulmonary/Chest: Effort normal. He has no wheezes.  Musculoskeletal: Normal range of motion. He exhibits no edema.  Skin:  2 cm raised indurated lesion left dorsal forearm.  There is a scant green purulent discharge from the wound.  No significant surrounding erythema or red streaking.    ED Course  Procedures (including critical care time)  INCISION AND DRAINAGE Performed by: Evalee Jefferson Consent: Verbal consent obtained. Risks and benefits: risks, benefits and alternatives were discussed Type: abscess  Body area: Left forearm  Anesthesia: local infiltration   Local anesthetic: lidocaine 1 % without epinephrine  Anesthetic total: 2 ml  Complexity: complex Blunt dissection to break up loculations  Drainage: purulent  Drainage amount: Moderate.  A large thick yellow/green core was expressed.  No incision was performed as there was a round been wound once the central core was expressed.  The wound was flushed with saline.  Packing material: No packing  Patient tolerance: Patient tolerated the procedure well with no immediate complications.    Labs Review Labs Reviewed - No data to display  Imaging Review No results found.   EKG Interpretation None      MDM   Final diagnoses:  Abscess    Patient was prescribed a few hydrocodone tablets, Keflex for infection.  Wound culture from a previous I&D performed in November showed staph aureus which was not sensitive to Bactrim.  Advised warm soaks twice a day.  Return here or his PCP for recheck for any worsened symptoms or if symptoms are not improved with this treatment.    Evalee Jefferson, PA-C 05/17/15 2028  Milton Ferguson, MD 05/17/15 [phone]

## 2015-06-08 ENCOUNTER — Encounter (HOSPITAL_COMMUNITY): Payer: Self-pay | Admitting: Emergency Medicine

## 2015-06-08 ENCOUNTER — Emergency Department (HOSPITAL_COMMUNITY)
Admission: EM | Admit: 2015-06-08 | Discharge: 2015-06-08 | Disposition: A | Discharge status: Home / Self Care | Payer: 59 | Attending: Emergency Medicine | Admitting: Emergency Medicine

## 2015-06-08 DIAGNOSIS — Z87442 Personal history of urinary calculi: Secondary | ICD-10-CM | POA: Insufficient documentation

## 2015-06-08 DIAGNOSIS — Z79899 Other long term (current) drug therapy: Secondary | ICD-10-CM | POA: Diagnosis not present

## 2015-06-08 DIAGNOSIS — L0501 Pilonidal cyst with abscess: Secondary | ICD-10-CM | POA: Insufficient documentation

## 2015-06-08 DIAGNOSIS — I10 Essential (primary) hypertension: Secondary | ICD-10-CM | POA: Insufficient documentation

## 2015-06-08 DIAGNOSIS — Z720 Tobacco use: Secondary | ICD-10-CM | POA: Insufficient documentation

## 2015-06-08 DIAGNOSIS — E780 Pure hypercholesterolemia: Secondary | ICD-10-CM | POA: Diagnosis not present

## 2015-06-08 DIAGNOSIS — L0591 Pilonidal cyst without abscess: Secondary | ICD-10-CM

## 2015-06-08 DIAGNOSIS — Z7982 Long term (current) use of aspirin: Secondary | ICD-10-CM | POA: Insufficient documentation

## 2015-06-08 MED ORDER — ONDANSETRON HCL 4 MG PO TABS
4.0000 mg | ORAL_TABLET | Freq: Once | ORAL | Status: AC
  Administered 2015-06-08: 4 mg via ORAL
  Filled 2015-06-08: qty 1

## 2015-06-08 MED ORDER — KETOROLAC TROMETHAMINE 10 MG PO TABS
10.0000 mg | ORAL_TABLET | Freq: Once | ORAL | Status: AC
  Administered 2015-06-08: 10 mg via ORAL
  Filled 2015-06-08: qty 1

## 2015-06-08 MED ORDER — LIDOCAINE-EPINEPHRINE (PF) 2 %-1:200000 IJ SOLN
10.0000 mL | Freq: Once | INTRAMUSCULAR | Status: AC
  Administered 2015-06-08: 10 mL
  Filled 2015-06-08: qty 20

## 2015-06-08 MED ORDER — DOXYCYCLINE HYCLATE 100 MG PO CAPS: 100.0000 mg | ORAL_CAPSULE | Freq: Two times a day (BID) | ORAL | Status: DC

## 2015-06-08 MED ORDER — OXYCODONE-ACETAMINOPHEN 5-325 MG PO TABS: 1.0000 | ORAL_TABLET | Freq: Four times a day (QID) | ORAL | Status: DC | PRN

## 2015-06-08 MED ORDER — DOXYCYCLINE HYCLATE 100 MG PO TABS
100.0000 mg | ORAL_TABLET | Freq: Once | ORAL | Status: AC
  Administered 2015-06-08: 100 mg via ORAL
  Filled 2015-06-08: qty 1

## 2015-06-08 MED ORDER — OXYCODONE-ACETAMINOPHEN 5-325 MG PO TABS
1.0000 | ORAL_TABLET | Freq: Once | ORAL | Status: AC
  Administered 2015-06-08: 1 via ORAL
  Filled 2015-06-08: qty 1

## 2015-06-08 MED ORDER — MELOXICAM 15 MG PO TABS: 15.0000 mg | ORAL_TABLET | Freq: Every day | ORAL | Status: DC

## 2015-06-08 NOTE — ED Provider Notes (Signed)
CSN: [id number]     Arrival date & time 06/08/15  1906 History   First MD Initiated Contact with Patient 06/08/15 2016     Chief Complaint  Patient presents with  . Abscess     (Consider location/radiation/quality/duration/timing/severity/associated sxs/prior Treatment) Patient is a 54 y.o. male presenting with abscess. The history is provided by the patient.  Abscess Location:  Ano-genital Ano-genital abscess location:  L buttock Abscess quality: induration, painful, redness and warmth   Abscess quality: not draining   Red streaking: no   Duration:  1 day Progression:  Worsening Pain details:    Quality:  Throbbing   Severity:  Severe   Duration:  1 day   Timing:  Intermittent   Progression:  Worsening Chronicity:  New Context: not diabetes   Relieved by:  Nothing Worsened by:  Nothing tried Ineffective treatments:  None tried Associated symptoms: no fever and no nausea   Risk factors: prior abscess   Risk factors: no hx of MRSA     Past Medical History  Diagnosis Date  . Hypertension   . High cholesterol   . Kidney stone   . Back pain   . Sciatica    Past Surgical History  Procedure Laterality Date  . Skin cancer excision    . Colonoscopy N/A 12/27/2014    Procedure: COLONOSCOPY;  Surgeon: Daneil Dolin, MD;  Location: [facility];  Service: Endoscopy;  Laterality: N/A;  1045am   History reviewed. No pertinent family history. History  Substance Use Topics  . Smoking status: Current Every Day Smoker    Types: Cigarettes  . Smokeless tobacco: Not on file  . Alcohol Use: No    Review of Systems  Constitutional: Negative for fever.  Gastrointestinal: Negative for nausea.  Musculoskeletal: Positive for back pain.  Skin: Positive for wound.  All other systems reviewed and are negative.     Allergies  Bee venom  Home Medications   Prior to Admission medications   Medication Sig Start Date End Date Taking? Authorizing Provider  aspirin 325 MG  tablet Take 325 mg by mouth daily.    Historical Provider, MD  cephALEXin (KEFLEX) 500 MG capsule Take 1 capsule (500 mg total) by mouth 4 (four) times daily. 05/17/15   Evalee Jefferson, PA-C  dexamethasone (DECADRON) 6 MG tablet 1 po bid with food Patient not taking: Reported on 11/28/2014 07/03/14   Lily Kocher, PA-C  diclofenac (VOLTAREN) 75 MG EC tablet Take 1 tablet (75 mg total) by mouth 2 (two) times daily. Patient not taking: Reported on 11/28/2014 07/03/14   Lily Kocher, PA-C  doxycycline (VIBRAMYCIN) 100 MG capsule Take 1 capsule (100 mg total) by mouth 2 (two) times daily. 06/08/15   Lily Kocher, PA-C  HYDROcodone-acetaminophen (NORCO/VICODIN) 5-325 MG per tablet Take 1 tablet by mouth every 4 (four) hours as needed. 05/17/15   Evalee Jefferson, PA-C  lisinopril (PRINIVIL,ZESTRIL) 5 MG tablet Take 1 tablet by mouth 2 (two) times daily.  12/13/14   Historical Provider, MD  meloxicam (MOBIC) 15 MG tablet Take 1 tablet (15 mg total) by mouth daily. 06/08/15   Lily Kocher, PA-C  methocarbamol (ROBAXIN) 500 MG tablet Take 1 tablet (500 mg total) by mouth 3 (three) times daily. Patient taking differently: Take 500 mg by mouth every 8 (eight) hours as needed for muscle spasms.  07/03/14   Lily Kocher, PA-C  oxyCODONE-acetaminophen (PERCOCET/ROXICET) 5-325 MG per tablet Take 1 tablet by mouth every 6 (six) hours as needed. 06/08/15  Lily Kocher, PA-C  simvastatin (ZOCOR) 20 MG tablet Take 1 tablet by mouth at bedtime.  11/16/14   Historical Provider, MD  sulfamethoxazole-trimethoprim (SEPTRA DS) 800-160 MG per tablet Take 1 tablet by mouth every 12 (twelve) hours. Patient not taking: Reported on 11/28/2014 10/19/14   Evalee Jefferson, PA-C   BP 102/82 mmHg  Pulse 111  Temp(Src) 98.5 F (36.9 C) (Oral)  Resp 15  Ht 5\' 4"  (1.626 m)  Wt 150 lb (68.04 kg)  BMI 25.73 kg/m2  SpO2 100% Physical Exam  Constitutional: He is oriented to person, place, and time. He appears well-developed and well-nourished.   Non-toxic appearance.  HENT:  Head: Normocephalic.  Right Ear: Tympanic membrane and external ear normal.  Left Ear: Tympanic membrane and external ear normal.  Eyes: EOM and lids are normal. Pupils are equal, round, and reactive to light.  Neck: Normal range of motion. Neck supple. Carotid bruit is not present.  Cardiovascular: Normal rate, regular rhythm, normal heart sounds, intact distal pulses and normal pulses.   Pulmonary/Chest: Breath sounds normal. No respiratory distress.  Abdominal: Soft. Bowel sounds are normal. There is no tenderness. There is no guarding.  Musculoskeletal: Normal range of motion.  Lymphadenopathy:       Head (right side): No submandibular adenopathy present.       Head (left side): No submandibular adenopathy present.    He has no cervical adenopathy.  Neurological: He is alert and oriented to person, place, and time. He has normal strength. No cranial nerve deficit or sensory deficit.  Skin: Skin is warm and dry.     Psychiatric: He has a normal mood and affect. His speech is normal.  Nursing note and vitals reviewed.   ED Course  INCISION AND DRAINAGE Date/Time: 06/08/2015 9:28 PM Performed by: Lily Kocher Authorized by: Lily Kocher Consent: Verbal consent obtained. Risks and benefits: risks, benefits and alternatives were discussed Consent given by: patient Patient understanding: patient states understanding of the procedure being performed Patient identity confirmed: arm band Time out: Immediately prior to procedure a "time out" was called to verify the correct patient, procedure, equipment, support staff and site/side marked as required. Type: pilonidal cyst Body area: anogenital Location details: gluteal cleft Anesthesia: local infiltration Local anesthetic: lidocaine 2% with epinephrine Patient sedated: no Scalpel size: 11 Incision type: single straight Complexity: simple Drainage amount: scant Wound treatment: wound left  open Patient tolerance: Patient tolerated the procedure well with no immediate complications   (including critical care time) Labs Review Labs Reviewed  CULTURE, ROUTINE-ABSCESS    Imaging Review No results found.   EKG Interpretation None      MDM  No red streaking noted at the buttocks cleft/pilonidal area. The area is warm, but not hot. There is no anal involvement.  Culture sent to the lab  Patient is placed on doxycycline, and Percocet for pain. The patient is to use warm tub soaks daily. Patient is referred to Dr. Arnoldo Morale for surgical evaluation.    Final diagnoses:  Pilonidal cyst    **I have reviewed nursing notes, vital signs, and all appropriate lab and imaging results for this patient.Lily Kocher, PA-C 06/08/15 2130  Milton Ferguson, MD 06/08/15 [phone]

## 2015-06-08 NOTE — ED Notes (Signed)
Patient complaining of abscess to left buttocks that he noticed this morning.

## 2015-06-08 NOTE — Discharge Instructions (Signed)
Your examination is consistent with a pilonidal cyst. Please soak in a warm tub of salt water for 15-20 minutes daily until wound heals from the inside out. After the wound has healed, please see Dr. Arnoldo Morale for surgical evaluation. Please use doxycycline 2 times daily with food. Please use mobile daily with food. Please use Percocet every 6 hours if needed for pain. Percocet may cause drowsiness, may also cause constipation, please use this medication with caution. Pilonidal Cyst A pilonidal cyst occurs when hairs get trapped (ingrown) beneath the skin in the crease between the buttocks over your sacrum (the bone under that crease). Pilonidal cysts are most common in young men with a lot of body hair. When the cyst is ruptured (breaks) or leaking, fluid from the cyst may cause burning and itching. If the cyst becomes infected, it causes a painful swelling filled with pus (abscess). The pus and trapped hairs need to be removed (often by lancing) so that the infection can heal. However, recurrence is common and an operation may be needed to remove the cyst. HOME CARE INSTRUCTIONS   If the cyst was NOT INFECTED:  Keep the area clean and dry. Bathe or shower daily. Wash the area well with a germ-killing soap. Warm tub baths may help prevent infection and help with drainage. Dry the area well with a towel.  Avoid tight clothing to keep area as moisture free as possible.  Keep area between buttocks as free of hair as possible. A depilatory may be used.  If the cyst WAS INFECTED and needed to be drained:  Your caregiver packed the wound with gauze to keep the wound open. This allows the wound to heal from the inside outwards and continue draining.  Return for a wound check in 1 day or as suggested.  If you take tub baths or showers, repack the wound with gauze following them. Sponge baths (at the sink) are a good alternative.  If an antibiotic was ordered to fight the infection, take as  directed.  Only take over-the-counter or prescription medicines for pain, discomfort, or fever as directed by your caregiver.  After the drain is removed, use sitz baths for 20 minutes 4 times per day. Clean the wound gently with mild unscented soap, pat dry, and then apply a dry dressing. SEEK MEDICAL CARE IF:   You have increased pain, swelling, redness, drainage, or bleeding from the area.  You have a fever.  You have muscles aches, dizziness, or a general ill feeling. Document Released: 11/28/2000 Document Revised: 02/23/2012 Document Reviewed: 01/26/2009 [facility] Patient Information 2015 [place], [place]. This information is not intended to replace advice given to you by your health care provider. Make sure you discuss any questions you have with your health care provider.

## 2015-06-12 LAB — CULTURE, ROUTINE-ABSCESS: GRAM STAIN: NONE SEEN

## 2015-06-13 ENCOUNTER — Telehealth (HOSPITAL_COMMUNITY): Payer: Self-pay

## 2015-06-13 NOTE — Telephone Encounter (Signed)
Post ED Visit - Positive Culture Follow-up  Culture report reviewed by antimicrobial stewardship pharmacist: []  Wes [place], Pharm.D., BCPS [x]  Heide Guile, Pharm.D., BCPS []  Alycia Rossetti, Pharm.D., BCPS []  [place], Pharm.D., BCPS, AAHIVP []  Legrand Como, Pharm.D., BCPS, AAHIVP []  Isac Sarna, Pharm.D., BCPS  Positive wound culture Treated with doxycycline, organism sensitive to the same and no further patient follow-up is required at this time.  Ileene Musa 06/13/2015, 5:18 PM

## 2015-08-24 ENCOUNTER — Emergency Department (HOSPITAL_COMMUNITY)
Admission: EM | Admit: 2015-08-24 | Discharge: 2015-08-24 | Disposition: A | Discharge status: Home / Self Care | Payer: 59 | Attending: Emergency Medicine | Admitting: Emergency Medicine

## 2015-08-24 ENCOUNTER — Encounter (HOSPITAL_COMMUNITY): Payer: Self-pay | Admitting: Emergency Medicine

## 2015-08-24 DIAGNOSIS — Z79899 Other long term (current) drug therapy: Secondary | ICD-10-CM | POA: Diagnosis not present

## 2015-08-24 DIAGNOSIS — E780 Pure hypercholesterolemia: Secondary | ICD-10-CM | POA: Diagnosis not present

## 2015-08-24 DIAGNOSIS — I10 Essential (primary) hypertension: Secondary | ICD-10-CM | POA: Diagnosis not present

## 2015-08-24 DIAGNOSIS — Z87442 Personal history of urinary calculi: Secondary | ICD-10-CM | POA: Diagnosis not present

## 2015-08-24 DIAGNOSIS — M5430 Sciatica, unspecified side: Secondary | ICD-10-CM | POA: Insufficient documentation

## 2015-08-24 DIAGNOSIS — H6092 Unspecified otitis externa, left ear: Secondary | ICD-10-CM

## 2015-08-24 DIAGNOSIS — R000 Tachycardia, unspecified: Secondary | ICD-10-CM | POA: Insufficient documentation

## 2015-08-24 DIAGNOSIS — Z7982 Long term (current) use of aspirin: Secondary | ICD-10-CM | POA: Insufficient documentation

## 2015-08-24 DIAGNOSIS — Z720 Tobacco use: Secondary | ICD-10-CM | POA: Diagnosis not present

## 2015-08-24 DIAGNOSIS — H9202 Otalgia, left ear: Secondary | ICD-10-CM | POA: Diagnosis present

## 2015-08-24 MED ORDER — ACETAMINOPHEN 325 MG PO TABS
650.0000 mg | ORAL_TABLET | Freq: Once | ORAL | Status: AC
  Administered 2015-08-24: 650 mg via ORAL
  Filled 2015-08-24: qty 2

## 2015-08-24 MED ORDER — ACETAMINOPHEN-CODEINE #3 300-30 MG PO TABS: 1.0000 | ORAL_TABLET | Freq: Four times a day (QID) | ORAL | Status: DC | PRN

## 2015-08-24 MED ORDER — CIPROFLOXACIN HCL 250 MG PO TABS
500.0000 mg | ORAL_TABLET | Freq: Once | ORAL | Status: AC
  Administered 2015-08-24: 500 mg via ORAL
  Filled 2015-08-24: qty 2

## 2015-08-24 MED ORDER — NEOMYCIN-POLYMYXIN-HC 1 % OT SOLN
4.0000 [drp] | Freq: Once | OTIC | Status: AC
  Administered 2015-08-24: 4 [drp] via OTIC
  Filled 2015-08-24: qty 10

## 2015-08-24 MED ORDER — CIPROFLOXACIN HCL 500 MG PO TABS: 500.0000 mg | ORAL_TABLET | Freq: Two times a day (BID) | ORAL | Status: DC

## 2015-08-24 MED ORDER — IBUPROFEN 800 MG PO TABS
800.0000 mg | ORAL_TABLET | Freq: Once | ORAL | Status: AC
  Administered 2015-08-24: 800 mg via ORAL
  Filled 2015-08-24: qty 1

## 2015-08-24 MED ORDER — DEXAMETHASONE 4 MG PO TABS: 4.0000 mg | ORAL_TABLET | Freq: Two times a day (BID) | ORAL | Status: DC

## 2015-08-24 NOTE — ED Notes (Signed)
Pt with left ear pain since this morning.

## 2015-08-24 NOTE — ED Provider Notes (Signed)
CSN: [id number]     Arrival date & time 08/24/15  1618 History   First MD Initiated Contact with Patient 08/24/15 1806     Chief Complaint  Patient presents with  . Otalgia     (Consider location/radiation/quality/duration/timing/severity/associated sxs/prior Treatment) Patient is a 54 y.o. male presenting with ear pain. The history is provided by the patient.  Otalgia Location:  Left Behind ear:  No abnormality Quality:  Aching Severity:  Moderate Onset quality:  Gradual Duration:  1 day Timing:  Intermittent Progression:  Worsening Chronicity:  Recurrent Relieved by:  Nothing Ineffective treatments:  None tried Associated symptoms: no fever, no sore throat and no vomiting   Risk factors: no recent travel     Past Medical History  Diagnosis Date  . Hypertension   . High cholesterol   . Kidney stone   . Back pain   . Sciatica    Past Surgical History  Procedure Laterality Date  . Skin cancer excision    . Colonoscopy N/A 12/27/2014    Procedure: COLONOSCOPY;  Surgeon: Daneil Dolin, MD;  Location: [facility];  Service: Endoscopy;  Laterality: N/A;  1045am   No family history on file. Social History  Substance Use Topics  . Smoking status: Current Every Day Smoker -- 0.50 packs/day    Types: Cigarettes  . Smokeless tobacco: None  . Alcohol Use: No    Review of Systems  Constitutional: Negative for fever.  HENT: Positive for ear pain. Negative for sore throat.   Gastrointestinal: Negative for vomiting.  All other systems reviewed and are negative.     Allergies  Bee venom  Home Medications   Prior to Admission medications   Medication Sig Start Date End Date Taking? Authorizing Provider  aspirin 325 MG tablet Take 325 mg by mouth daily.   Yes Historical Provider, MD  lisinopril (PRINIVIL,ZESTRIL) 5 MG tablet Take 1 tablet by mouth 2 (two) times daily.  12/13/14  Yes Historical Provider, MD  simvastatin (ZOCOR) 20 MG tablet Take 1 tablet by mouth at  bedtime.  11/16/14  Yes Historical Provider, MD  cephALEXin (KEFLEX) 500 MG capsule Take 1 capsule (500 mg total) by mouth 4 (four) times daily. Patient not taking: Reported on 08/24/2015 05/17/15   Evalee Jefferson, PA-C  dexamethasone (DECADRON) 6 MG tablet 1 po bid with food Patient not taking: Reported on 11/28/2014 07/03/14   Lily Kocher, PA-C  diclofenac (VOLTAREN) 75 MG EC tablet Take 1 tablet (75 mg total) by mouth 2 (two) times daily. Patient not taking: Reported on 11/28/2014 07/03/14   Lily Kocher, PA-C  doxycycline (VIBRAMYCIN) 100 MG capsule Take 1 capsule (100 mg total) by mouth 2 (two) times daily. Patient not taking: Reported on 08/24/2015 06/08/15   Lily Kocher, PA-C  HYDROcodone-acetaminophen (NORCO/VICODIN) 5-325 MG per tablet Take 1 tablet by mouth every 4 (four) hours as needed. Patient not taking: Reported on 08/24/2015 05/17/15   Evalee Jefferson, PA-C  meloxicam (MOBIC) 15 MG tablet Take 1 tablet (15 mg total) by mouth daily. Patient not taking: Reported on 08/24/2015 06/08/15   Lily Kocher, PA-C  methocarbamol (ROBAXIN) 500 MG tablet Take 1 tablet (500 mg total) by mouth 3 (three) times daily. Patient taking differently: Take 500 mg by mouth every 8 (eight) hours as needed for muscle spasms.  07/03/14   Lily Kocher, PA-C  oxyCODONE-acetaminophen (PERCOCET/ROXICET) 5-325 MG per tablet Take 1 tablet by mouth every 6 (six) hours as needed. Patient not taking: Reported on 08/24/2015 06/08/15  Lily Kocher, PA-C  sulfamethoxazole-trimethoprim (SEPTRA DS) 800-160 MG per tablet Take 1 tablet by mouth every 12 (twelve) hours. Patient not taking: Reported on 11/28/2014 10/19/14   Evalee Jefferson, PA-C   BP 109/77 mmHg  Pulse 105  Temp(Src) 98.4 F (36.9 C) (Oral)  Resp 16  Ht 5\' 4"  (1.626 m)  Wt 150 lb (68.04 kg)  BMI 25.73 kg/m2  SpO2 100% Physical Exam  Constitutional: He is oriented to person, place, and time. He appears well-developed and well-nourished.  Non-toxic appearance.  HENT:  Head:  Normocephalic.  Right Ear: Tympanic membrane and external ear normal.  Left Ear: Tympanic membrane and external ear normal.  There is mild soreness of the mastoid area. No redness or swelling appreciated.  The external auditory canal has swelling and mild drainage present. I cannot see the tympanic membrane at this time. There is some preauricular tenderness also present, extending into the neck. No palpable nodes appreciated.  Eyes: EOM and lids are normal. Pupils are equal, round, and reactive to light.  Neck: Normal range of motion. Neck supple. Carotid bruit is not present.  Cardiovascular: Regular rhythm, normal heart sounds, intact distal pulses and normal pulses.  Tachycardia present.   Pulmonary/Chest: Breath sounds normal. No respiratory distress.  Abdominal: Soft. Bowel sounds are normal. There is no tenderness. There is no guarding.  Musculoskeletal: Normal range of motion.  Lymphadenopathy:       Head (right side): No submandibular adenopathy present.       Head (left side): No submandibular adenopathy present.    He has no cervical adenopathy.  Neurological: He is alert and oriented to person, place, and time. He has normal strength. No cranial nerve deficit or sensory deficit.  Skin: Skin is warm and dry.  Psychiatric: He has a normal mood and affect. His speech is normal.  Nursing note and vitals reviewed.   ED Course  Procedures (including critical care time) Labs Review Labs Reviewed - No data to display  Imaging Review No results found. I have personally reviewed and evaluated these images and lab results as part of my medical decision-making.   EKG Interpretation None      MDM  Examination favors an external otitis.  Vital signs reviewed. The patient will be treated with Cipro, Cortisporin otic, Decadron, and Tylenol codeine.    Final diagnoses:  None    **I have reviewed nursing notes, vital signs, and all appropriate lab and imaging results for this  patient.Lily Kocher, PA-C 08/24/15 1833  Fredia Sorrow, MD 08/25/15 [phone]

## 2015-08-24 NOTE — Discharge Instructions (Signed)
Please apply 4 drops of the Cortisporin otic in the left ear 3 times daily for the next 7 days. Please use Cipro and Decadron daily with food until all taken. Please see Dr. Daryll Brod Otitis Externa Otitis externa is a germ infection in the outer ear. The outer ear is the area from the eardrum to the outside of the ear. Otitis externa is sometimes called "swimmer's ear." HOME CARE  Put drops in the ear as told by your doctor.  Only take medicine as told by your doctor.  If you have diabetes, your doctor may give you more directions. Follow your doctor's directions.  Keep all doctor visits as told. To avoid another infection:  Keep your ear dry. Use the corner of a towel to dry your ear after swimming or bathing.  Avoid scratching or putting things inside your ear.  Avoid swimming in lakes, dirty water, or pools that use a chemical called chlorine poorly.  You may use ear drops after swimming. Combine equal amounts of white vinegar and alcohol in a bottle. Put 3 or 4 drops in each ear. GET HELP IF:   You have a fever.  Your ear is still red, puffy (swollen), or painful after 3 days.  You still have yellowish-white fluid (pus) coming from the ear after 3 days.  Your redness, puffiness, or pain gets worse.  You have a really bad headache.  You have redness, puffiness, pain, or tenderness behind your ear. MAKE SURE YOU:   Understand these instructions.  Will watch your condition.  Will get help right away if you are not doing well or get worse. Document Released: 05/19/2008 Document Revised: 04/17/2014 Document Reviewed: 12/18/2011 [facility] Patient Information 2015 [place], [place]. This information is not intended to replace advice given to you by your health care provider. Make sure you discuss any questions you have with your health care provider. sco for follow-up and recheck in the office.

## 2015-08-24 NOTE — ED Notes (Signed)
Discharge instructions including prescriptions reviewed with patient. Ear drops given to instructions. Work note offered but declined.

## 2017-09-22 ENCOUNTER — Emergency Department (HOSPITAL_COMMUNITY)
Admission: EM | Admit: 2017-09-22 | Discharge: 2017-09-23 | Disposition: A | Discharge status: Home / Self Care | Payer: 59 | Attending: Emergency Medicine | Admitting: Emergency Medicine

## 2017-09-22 ENCOUNTER — Encounter (HOSPITAL_COMMUNITY): Payer: Self-pay | Admitting: *Deleted

## 2017-09-22 DIAGNOSIS — Z79899 Other long term (current) drug therapy: Secondary | ICD-10-CM | POA: Insufficient documentation

## 2017-09-22 DIAGNOSIS — R509 Fever, unspecified: Secondary | ICD-10-CM

## 2017-09-22 DIAGNOSIS — Z7982 Long term (current) use of aspirin: Secondary | ICD-10-CM | POA: Insufficient documentation

## 2017-09-22 DIAGNOSIS — I10 Essential (primary) hypertension: Secondary | ICD-10-CM | POA: Insufficient documentation

## 2017-09-22 DIAGNOSIS — F17210 Nicotine dependence, cigarettes, uncomplicated: Secondary | ICD-10-CM | POA: Diagnosis not present

## 2017-09-22 DIAGNOSIS — R51 Headache: Secondary | ICD-10-CM | POA: Diagnosis present

## 2017-09-22 DIAGNOSIS — D72829 Elevated white blood cell count, unspecified: Secondary | ICD-10-CM | POA: Diagnosis not present

## 2017-09-22 LAB — RAPID STREP SCREEN (MED CTR MEBANE ONLY): STREPTOCOCCUS, GROUP A SCREEN (DIRECT): NEGATIVE

## 2017-09-22 LAB — COMPREHENSIVE METABOLIC PANEL
ALBUMIN: 3.9 g/dL (ref 3.5–5.0)
ALT: 9 U/L — ABNORMAL LOW (ref 17–63)
ANION GAP: 8 (ref 5–15)
AST: 15 U/L (ref 15–41)
Alkaline Phosphatase: 99 U/L (ref 38–126)
BILIRUBIN TOTAL: 0.9 mg/dL (ref 0.3–1.2)
BUN: 9 mg/dL (ref 6–20)
CALCIUM: 9 mg/dL (ref 8.9–10.3)
CO2: 24 mmol/L (ref 22–32)
Chloride: 103 mmol/L (ref 101–111)
Creatinine, Ser: 0.96 mg/dL (ref 0.61–1.24)
GFR calc Af Amer: >60 mL/min (ref >60)
GFR calc non Af Amer: >60 mL/min (ref >60)
GLUCOSE: 102 mg/dL — AB (ref 65–99)
Potassium: 3.7 mmol/L (ref 3.5–5.1)
Sodium: 135 mmol/L (ref 135–145)
Total Protein: 7.3 g/dL (ref 6.5–8.1)

## 2017-09-22 LAB — CBC WITH DIFFERENTIAL/PLATELET
Basophils Absolute: 0 10*3/uL (ref 0.0–0.1)
Basophils Relative: 0 %
Eosinophils Absolute: 0.1 10*3/uL (ref 0.0–0.7)
Eosinophils Relative: 1 %
HCT: 43.9 % (ref 39.0–52.0)
HEMOGLOBIN: 15.3 g/dL (ref 13.0–17.0)
Lymphocytes Relative: 8 %
Lymphs Abs: 1.5 10*3/uL (ref 0.7–4.0)
MCH: 33.1 pg (ref 26.0–34.0)
MCHC: 34.9 g/dL (ref 30.0–36.0)
MCV: 95 fL (ref 78.0–100.0)
MONOS PCT: 7 %
Monocytes Absolute: 1.2 10*3/uL — ABNORMAL HIGH (ref 0.1–1.0)
NEUTROS PCT: 84 %
Neutro Abs: 15.4 10*3/uL — ABNORMAL HIGH (ref 1.7–7.7)
Platelets: 274 10*3/uL (ref 150–400)
RBC: 4.62 MIL/uL (ref 4.22–5.81)
RDW: 14.4 % (ref 11.5–15.5)
WBC: 18.2 10*3/uL — ABNORMAL HIGH (ref 4.0–10.5)

## 2017-09-22 LAB — I-STAT CG4 LACTIC ACID, ED: Lactic Acid, Venous: 0.96 mmol/L (ref 0.5–1.9)

## 2017-09-22 MED ORDER — SODIUM CHLORIDE 0.9 % IV BOLUS (SEPSIS)
1000.0000 mL | Freq: Once | INTRAVENOUS | Status: AC
  Administered 2017-09-22: 1000 mL via INTRAVENOUS

## 2017-09-22 MED ORDER — IBUPROFEN 800 MG PO TABS: 800.0000 mg | ORAL_TABLET | Freq: Three times a day (TID) | ORAL | 0 refills | Status: AC

## 2017-09-22 MED ORDER — KETOROLAC TROMETHAMINE 30 MG/ML IJ SOLN
30.0000 mg | Freq: Once | INTRAMUSCULAR | Status: AC
  Administered 2017-09-22: 30 mg via INTRAVENOUS
  Filled 2017-09-22: qty 1

## 2017-09-22 MED ORDER — ACETAMINOPHEN 500 MG PO TABS
1000.0000 mg | ORAL_TABLET | Freq: Once | ORAL | Status: AC
  Administered 2017-09-22: 1000 mg via ORAL
  Filled 2017-09-22: qty 2

## 2017-09-22 NOTE — ED Provider Notes (Signed)
[facility] DEPT Provider Note   CSN: [id number] Arrival date & time: 09/22/17  2141     History   Chief Complaint Chief Complaint  Patient presents with  . Headache    HPI Glen Livingston is a 56 y.o. male.  HPI  The patient is a 56 year old male with a known history of high blood pressure and high cholesterol who presents with a complaint of headache which started this evening, began after he got home from work after a normal day of work. No unusual circumstances, no injuries to his head. The headache is in the front of the head, and is similar to prior headaches - however he states that he has some associated chills this evening.  He denies all other symptoms including:  No fevers, headache, sore throat, visual changes, neck pain, back pain, chest pain, abdominal pain, shortness of breath, cough, dysuria, diarrhea, rectal bleeding, swelling, rashes, numbness or weakness.  No tick bites, no rashes, no rectal bleeding.  Denies any sick contact or recent travel.     Past Medical History:  Diagnosis Date  . Back pain   . High cholesterol   . Hypertension   . Kidney stone   . Sciatica     Patient Active Problem List   Diagnosis Date Noted  . History of colonic polyps   . CAP (community acquired pneumonia) 07/05/2012  . Hypokalemia 07/05/2012  . Hyponatremia 07/05/2012  . Nausea and vomiting 07/05/2012  . Benign hypertension 07/05/2012  . Tobacco abuse 07/05/2012  . Hyperlipidemia 07/05/2012  . History of nephrolithiasis 07/05/2012    Past Surgical History:  Procedure Laterality Date  . COLONOSCOPY N/A 12/27/2014   Procedure: COLONOSCOPY;  Surgeon: Daneil Dolin, MD;  Location: [facility];  Service: Endoscopy;  Laterality: N/A;  1045am  . SKIN CANCER EXCISION         Home Medications    Prior to Admission medications   Medication Sig Start Date End Date Taking? Authorizing Provider  aspirin 325 MG tablet Take 325 mg by mouth daily.   Yes [provider]  lisinopril (PRINIVIL,ZESTRIL) 5 MG tablet Take 1 tablet by mouth 2 (two) times daily.  12/13/14  Yes [provider]  simvastatin (ZOCOR) 20 MG tablet Take 1 tablet by mouth at bedtime.  11/16/14  Yes [provider]  ibuprofen (ADVIL,MOTRIN) 800 MG tablet Take 1 tablet (800 mg total) by mouth 3 (three) times daily. 09/22/17   Noemi Chapel, MD    Family History History reviewed. No pertinent family history.  Social History Social History  Substance Use Topics  . Smoking status: Current Every Day Smoker    Packs/day: 1.00    Types: Cigarettes  . Smokeless tobacco: Never Used  . Alcohol use No     Allergies   Bee venom   Review of Systems Review of Systems  All other systems reviewed and are negative.    Physical Exam Updated Vital Signs BP 110/88   Pulse 98   Temp 99.7 F (37.6 C) (Oral)   Resp 20   Ht 5\' 4"  (1.626 m)   Wt 64.4 kg (142 lb)   SpO2 96%   BMI 24.37 kg/m   Physical Exam  Constitutional: He appears well-developed and well-nourished. No distress.  HENT:  Head: Normocephalic and atraumatic.  Mouth/Throat: Oropharynx is clear and moist. No oropharyngeal exudate.  No exudate, no asymetry, no redness, normal phonation and normal MM  Eyes: Pupils are equal, round, and reactive to light.  Conjunctivae and EOM are normal. Right eye exhibits no discharge. Left eye exhibits no discharge. No scleral icterus.  Neck: Normal range of motion. Neck supple. No JVD present. No thyromegaly present.  Very supple neck without any LAD  Cardiovascular: Regular rhythm, normal heart sounds and intact distal pulses.  Exam reveals no gallop and no friction rub.   No murmur heard. Mild tachycardia  Pulmonary/Chest: Effort normal and breath sounds normal. No respiratory distress. He has no wheezes. He has no rales.  Abdominal: Soft. Bowel sounds are normal. He exhibits no distension and no mass. There is no tenderness.  Musculoskeletal: Normal  range of motion. He exhibits no edema or tenderness.  Lymphadenopathy:    He has no cervical adenopathy.  Neurological: He is alert. Coordination normal.  Totally normal speech, coordination and strength in all 4 extremiteis - normal memory and Level of Alertness  Skin: Skin is warm and dry. No rash noted. No erythema.  No rashes to skin  Psychiatric: He has a normal mood and affect. His behavior is normal.  Nursing note and vitals reviewed.    ED Treatments / Results  Labs (all labs ordered are listed, but only abnormal results are displayed) Labs Reviewed  CBC WITH DIFFERENTIAL/PLATELET - Abnormal; Notable for the following:       Result Value   WBC 18.2 (*)    Neutro Abs 15.4 (*)    Monocytes Absolute 1.2 (*)    All other components within normal limits  COMPREHENSIVE METABOLIC PANEL - Abnormal; Notable for the following:    Glucose, Bld 102 (*)    ALT 9 (*)    All other components within normal limits  RAPID STREP SCREEN (NOT AT [facility])  CULTURE, GROUP A STREP ([place])  I-STAT CG4 LACTIC ACID, ED     Radiology No results found.  Procedures Procedures (including critical care time)  Medications Ordered in ED Medications  acetaminophen (TYLENOL) tablet 1,000 mg (1,000 mg Oral Given 09/22/17 2254)  ketorolac (TORADOL) 30 MG/ML injection 30 mg (30 mg Intravenous Given 09/22/17 2301)  sodium chloride 0.9 % bolus 1,000 mL (1,000 mLs Intravenous New Bag/Given 09/22/17 2329)     Initial Impression / Assessment and Plan / ED Course  I have reviewed the triage vital signs and the nursing notes.  Pertinent labs & imaging results that were available during my care of the patient were reviewed by me and considered in my medical decision making (see chart for details).     At this time the patient has no focal findings to suggest a source of infection but is hot to the touch suggestive of a fever. He is mildly tachycardic. The patient is normotensive. We'll obtain basic labs,  lactic acid and a rapid strep however the patient has absolutely no source of infection on exam.  The patient has had a negative strep test, negative metabolic panel, lactic acid was 0.9, fever defervesced with medications and tachycardia resolved. On repeat exam the patient is well-appearing with minimal headache, has no other symptoms at all and was informed of all of his results including the leukocytosis which he will need to follow-up. At this time we do not have a source either on history exam or lab testing. He will need very close follow-up and he expressed his understanding to this.  He actually can follow-up with his doctor or the walk-in clinic within 1-2 days. The patient also expressed his understanding for the indications for return.  Despite not having a source for  the patients fever he is well appearing and agreeable to d/c.  Final Clinical Impressions(s) / ED Diagnoses   Final diagnoses:  Fever, unspecified fever cause  Leukocytosis, unspecified type    New Prescriptions New Prescriptions   IBUPROFEN (ADVIL,MOTRIN) 800 MG TABLET    Take 1 tablet (800 mg total) by mouth 3 (three) times daily.     Noemi Chapel, MD 09/22/17 [phone]

## 2017-09-22 NOTE — Discharge Instructions (Signed)
Your blood counts were at 18,000, this is abnormal - and it MUST be rechecked to make sure that it is getting better Your strep test was normal  We do not know where your infection is because it is too early - if you should Develop severe or worsening symptoms including chest pain, difficulty breathing, stiffness of her neck, confusion, weakness, blurred vision, increased headache, rashes or swelling or abdominal pain or vomiting or diarrhea, you must return to the emergency department immediately.  You should see your family doctor within the next 1-2 days for a recheck  Please alternate  Tylenol, 1000 mg and ibuprofen 800 mg every 4 hours for fever and headache.

## 2017-09-22 NOTE — ED Triage Notes (Signed)
Pt reports headache and chills that started around 5:30 today

## 2017-09-23 DIAGNOSIS — Z1389 Encounter for screening for other disorder: Secondary | ICD-10-CM | POA: Diagnosis not present

## 2017-09-23 DIAGNOSIS — R51 Headache: Secondary | ICD-10-CM | POA: Diagnosis not present

## 2017-09-24 ENCOUNTER — Emergency Department (HOSPITAL_COMMUNITY): Payer: 59

## 2017-09-24 ENCOUNTER — Encounter (HOSPITAL_COMMUNITY): Payer: Self-pay

## 2017-09-24 ENCOUNTER — Emergency Department (HOSPITAL_COMMUNITY)
Admission: EM | Admit: 2017-09-24 | Discharge: 2017-09-24 | Disposition: A | Discharge status: Home / Self Care | Payer: 59 | Attending: Emergency Medicine | Admitting: Emergency Medicine

## 2017-09-24 DIAGNOSIS — Z79899 Other long term (current) drug therapy: Secondary | ICD-10-CM | POA: Insufficient documentation

## 2017-09-24 DIAGNOSIS — J3489 Other specified disorders of nose and nasal sinuses: Secondary | ICD-10-CM | POA: Diagnosis not present

## 2017-09-24 DIAGNOSIS — R51 Headache: Secondary | ICD-10-CM | POA: Diagnosis present

## 2017-09-24 DIAGNOSIS — R112 Nausea with vomiting, unspecified: Secondary | ICD-10-CM | POA: Diagnosis not present

## 2017-09-24 DIAGNOSIS — J029 Acute pharyngitis, unspecified: Secondary | ICD-10-CM | POA: Diagnosis not present

## 2017-09-24 DIAGNOSIS — R509 Fever, unspecified: Secondary | ICD-10-CM | POA: Insufficient documentation

## 2017-09-24 DIAGNOSIS — R590 Localized enlarged lymph nodes: Secondary | ICD-10-CM | POA: Insufficient documentation

## 2017-09-24 DIAGNOSIS — I10 Essential (primary) hypertension: Secondary | ICD-10-CM | POA: Diagnosis not present

## 2017-09-24 DIAGNOSIS — F17210 Nicotine dependence, cigarettes, uncomplicated: Secondary | ICD-10-CM | POA: Insufficient documentation

## 2017-09-24 DIAGNOSIS — R05 Cough: Secondary | ICD-10-CM | POA: Diagnosis not present

## 2017-09-24 DIAGNOSIS — R519 Headache, unspecified: Secondary | ICD-10-CM

## 2017-09-24 DIAGNOSIS — Z7982 Long term (current) use of aspirin: Secondary | ICD-10-CM | POA: Insufficient documentation

## 2017-09-24 DIAGNOSIS — G43709 Chronic migraine without aura, not intractable, without status migrainosus: Secondary | ICD-10-CM | POA: Diagnosis not present

## 2017-09-24 LAB — CBC WITH DIFFERENTIAL/PLATELET
BASOS ABS: 0.1 10*3/uL (ref 0.0–0.1)
BASOS PCT: 0 %
Eosinophils Absolute: 0.1 10*3/uL (ref 0.0–0.7)
Eosinophils Relative: 0 %
HCT: 42.3 % (ref 39.0–52.0)
Hemoglobin: 14.8 g/dL (ref 13.0–17.0)
LYMPHS PCT: 4 %
Lymphs Abs: 0.9 10*3/uL (ref 0.7–4.0)
MCH: 32.9 pg (ref 26.0–34.0)
MCHC: 35 g/dL (ref 30.0–36.0)
MCV: 94 fL (ref 78.0–100.0)
Monocytes Absolute: 1.2 10*3/uL — ABNORMAL HIGH (ref 0.1–1.0)
Monocytes Relative: 5 %
NEUTROS ABS: 19.7 10*3/uL — AB (ref 1.7–7.7)
NEUTROS PCT: 91 %
PLATELETS: 256 10*3/uL (ref 150–400)
RBC: 4.5 MIL/uL (ref 4.22–5.81)
RDW: 14 % (ref 11.5–15.5)
WBC: 21.9 10*3/uL — AB (ref 4.0–10.5)

## 2017-09-24 LAB — BASIC METABOLIC PANEL
ANION GAP: 10 (ref 5–15)
BUN: 10 mg/dL (ref 6–20)
CO2: 21 mmol/L — ABNORMAL LOW (ref 22–32)
Calcium: 8.9 mg/dL (ref 8.9–10.3)
Chloride: 103 mmol/L (ref 101–111)
Creatinine, Ser: 0.97 mg/dL (ref 0.61–1.24)
Glucose, Bld: 140 mg/dL — ABNORMAL HIGH (ref 65–99)
POTASSIUM: 3.6 mmol/L (ref 3.5–5.1)
SODIUM: 134 mmol/L — AB (ref 135–145)

## 2017-09-24 LAB — INFLUENZA PANEL BY PCR (TYPE A & B)
INFLAPCR: NEGATIVE
Influenza B By PCR: NEGATIVE

## 2017-09-24 MED ORDER — PROCHLORPERAZINE EDISYLATE 5 MG/ML IJ SOLN
5.0000 mg | Freq: Once | INTRAMUSCULAR | Status: AC
  Administered 2017-09-24: 5 mg via INTRAVENOUS
  Filled 2017-09-24: qty 2

## 2017-09-24 MED ORDER — DOXYCYCLINE HYCLATE 100 MG PO CAPS: 100.0000 mg | ORAL_CAPSULE | Freq: Two times a day (BID) | ORAL | 0 refills | Status: AC

## 2017-09-24 MED ORDER — ACETAMINOPHEN 500 MG PO TABS
1000.0000 mg | ORAL_TABLET | Freq: Once | ORAL | Status: AC
  Administered 2017-09-24: 1000 mg via ORAL
  Filled 2017-09-24: qty 2

## 2017-09-24 MED ORDER — SODIUM CHLORIDE 0.9 % IV BOLUS (SEPSIS)
1000.0000 mL | Freq: Once | INTRAVENOUS | Status: AC
  Administered 2017-09-24: 1000 mL via INTRAVENOUS

## 2017-09-24 MED ORDER — DIPHENHYDRAMINE HCL 50 MG/ML IJ SOLN
12.5000 mg | Freq: Once | INTRAMUSCULAR | Status: AC
  Administered 2017-09-24: 12.5 mg via INTRAVENOUS
  Filled 2017-09-24: qty 1

## 2017-09-24 NOTE — Discharge Instructions (Signed)
You were seen today for fever and headache. Your workup is notable for an increased white count which sometimes indicates infection. There is no evidence of pneumonia and your flu testing is negative. You were offered a lumbar puncture to rule out meningitis. He declined. He developed neck stiffness or worsening headache, you should be reevaluated. You will be placed on doxycycline to cover for pneumonia and tickborne illness.  Please follow-up with her primary physician for recheck.

## 2017-09-24 NOTE — ED Triage Notes (Signed)
Pt reports headache since Tuesday, was seen here for same yesterday and also saw Dr Gerarda Fraction today.  Pt states he is taking ibuprofen every 3 hours for headache.

## 2017-09-24 NOTE — ED Provider Notes (Signed)
[facility] DEPT Provider Note   CSN: [id number] Arrival date & time: 09/24/17  0055     History   Chief Complaint Chief Complaint  Patient presents with  . Headache    HPI Glen Livingston is a 56 y.o. male.  HPI  This is a 56 year old male with a history of hypertension who presents with headache and fever. Patient reports ongoing headache since yesterday. He was seen and evaluated on Tuesday night for headache. At that time he was noted to have a fever and chills. He has since followed up with his primary physician and states that "they were going to get a CT scan."  Patient reports 9 out of 10 headache that is throbbing in nature. No vision changes, weakness, numbness, tingling. He does report new cough and runny nose as well as sore throat. No known sick contacts. Denies neck stiffness, rashes, tick exposures. Denies urinary symptoms.  He has not received his flu shot.  Chart reviewed. Patient had nonspecific leukocytosis but otherwise reassuring lab work including strep screen approximately 24 hours ago.  Past Medical History:  Diagnosis Date  . Back pain   . High cholesterol   . Hypertension   . Kidney stone   . Sciatica     Patient Active Problem List   Diagnosis Date Noted  . History of colonic polyps   . CAP (community acquired pneumonia) 07/05/2012  . Hypokalemia 07/05/2012  . Hyponatremia 07/05/2012  . Nausea and vomiting 07/05/2012  . Benign hypertension 07/05/2012  . Tobacco abuse 07/05/2012  . Hyperlipidemia 07/05/2012  . History of nephrolithiasis 07/05/2012    Past Surgical History:  Procedure Laterality Date  . COLONOSCOPY N/A 12/27/2014   Procedure: COLONOSCOPY;  Surgeon: Daneil Dolin, MD;  Location: [facility];  Service: Endoscopy;  Laterality: N/A;  1045am  . SKIN CANCER EXCISION         Home Medications    Prior to Admission medications   Medication Sig Start Date End Date Taking? Authorizing Provider  aspirin 325 MG tablet Take  325 mg by mouth daily.    [provider]  doxycycline (VIBRAMYCIN) 100 MG capsule Take 1 capsule (100 mg total) by mouth 2 (two) times daily. 09/24/17   Pearson Reasons, Barbette Hair, MD  ibuprofen (ADVIL,MOTRIN) 800 MG tablet Take 1 tablet (800 mg total) by mouth 3 (three) times daily. 09/22/17   Noemi Chapel, MD  lisinopril (PRINIVIL,ZESTRIL) 5 MG tablet Take 1 tablet by mouth 2 (two) times daily.  12/13/14   [provider]  simvastatin (ZOCOR) 20 MG tablet Take 1 tablet by mouth at bedtime.  11/16/14   [provider]    Family History No family history on file.  Social History Social History  Substance Use Topics  . Smoking status: Current Every Day Smoker    Packs/day: 1.00    Types: Cigarettes  . Smokeless tobacco: Never Used  . Alcohol use No     Allergies   Bee venom   Review of Systems Review of Systems  Constitutional: Positive for fever.  HENT: Positive for rhinorrhea and sore throat. Negative for ear pain.   Respiratory: Positive for cough. Negative for shortness of breath.   Cardiovascular: Negative for chest pain.  Gastrointestinal: Positive for nausea and vomiting. Negative for abdominal pain and diarrhea.  Genitourinary: Negative for dysuria.  Musculoskeletal: Negative for neck pain and neck stiffness.  Neurological: Positive for headaches. Negative for weakness and numbness.  All other systems reviewed and are negative.  Physical Exam Updated Vital Signs BP 109/86   Pulse (!) 102   Temp 99.6 F (37.6 C) (Oral)   Resp 16   Ht 5\' 4"  (1.626 m)   Wt 64.4 kg (142 lb)   SpO2 95%   BMI 24.37 kg/m   Physical Exam  Constitutional: He is oriented to person, place, and time. No distress.  HENT:  Head: Normocephalic and atraumatic.  Erythema posterior oropharynx, no exudate, uvula midline, no significant swelling or evidence of deep space infection, bilateral TMs clear  Eyes: Pupils are equal, round, and reactive to light.  Neck:  Normal range of motion. Neck supple.  No meningismus  Cardiovascular: Regular rhythm and normal heart sounds.   No murmur heard. Tachycardia  Pulmonary/Chest: Effort normal and breath sounds normal. No respiratory distress. He has no wheezes.  Rails bilateral lower lobes  Abdominal: Soft. There is no tenderness.  Musculoskeletal: He exhibits no edema.  Lymphadenopathy:    He has cervical adenopathy.  Neurological: He is alert and oriented to person, place, and time.  Skin: Skin is warm and dry. No rash noted.  Psychiatric: He has a normal mood and affect.  Nursing note and vitals reviewed.    ED Treatments / Results  Labs (all labs ordered are listed, but only abnormal results are displayed) Labs Reviewed  CBC WITH DIFFERENTIAL/PLATELET - Abnormal; Notable for the following:       Result Value   WBC 21.9 (*)    Neutro Abs 19.7 (*)    Monocytes Absolute 1.2 (*)    All other components within normal limits  BASIC METABOLIC PANEL - Abnormal; Notable for the following:    Sodium 134 (*)    CO2 21 (*)    Glucose, Bld 140 (*)    All other components within normal limits  INFLUENZA PANEL BY PCR (TYPE A & B)    EKG  EKG Interpretation None       Radiology Dg Chest 2 View  Result Date: 09/24/2017 CLINICAL DATA:  Acute onset of productive cough and headache. Initial encounter. EXAM: CHEST  2 VIEW COMPARISON:  None. FINDINGS: The lungs are well-aerated and clear. There is no evidence of focal opacification, pleural effusion or pneumothorax. The heart is normal in size; the mediastinal contour is within normal limits. No acute osseous abnormalities are seen. IMPRESSION: No acute cardiopulmonary process seen. Electronically Signed   By: Garald Balding M.D.   On: 09/24/2017 02:24   Ct Head Wo Contrast  Result Date: 09/24/2017 CLINICAL DATA:  Sudden onset headache, duration 1 day. EXAM: CT HEAD WITHOUT CONTRAST TECHNIQUE: Contiguous axial images were obtained from the base of the  skull through the vertex without intravenous contrast. COMPARISON:  07/04/2012 FINDINGS: Brain: There is no intracranial hemorrhage, mass or evidence of acute infarction. There is no extra-axial fluid collection. Gray matter and white matter appear normal. Cerebral volume is normal for age. Brainstem and posterior fossa are unremarkable. The CSF spaces appear normal. Vascular: No hyperdense vessel or unexpected calcification. Skull: Normal. Negative for fracture or focal lesion. Sinuses/Orbits: No acute finding. Other: None. IMPRESSION: Normal brain Electronically Signed   By: Andreas Newport M.D.   On: 09/24/2017 02:58    Procedures Procedures (including critical care time)  Medications Ordered in ED Medications  acetaminophen (TYLENOL) tablet 1,000 mg (1,000 mg Oral Given 09/24/17 0144)  sodium chloride 0.9 % bolus 1,000 mL (0 mLs Intravenous Stopped 09/24/17 0334)  diphenhydrAMINE (BENADRYL) injection 12.5 mg (12.5 mg Intravenous Given 09/24/17 0145)  prochlorperazine (COMPAZINE) injection 5 mg (5 mg Intravenous Given 09/24/17 0147)     Initial Impression / Assessment and Plan / ED Course  I have reviewed the triage vital signs and the nursing notes.  Pertinent labs & imaging results that were available during my care of the patient were reviewed by me and considered in my medical decision making (see chart for details).     Patient presents with headache. In the last 24 hours reports that he has developed some upper respiratory symptoms including sore throat and cough. He is nontoxic-appearing. Temperature of 101.2.  He has some tachycardia but otherwise his vital signs are reassuring. Patient was given fluids, Tylenol, migraine cocktail. Basic labs obtained. Considerations include influenza, pneumonia, viral upper respiratory infection, less likely meningitis given no evidence of meningismus on exam and the fact that the patient is generally well-appearing without rash. If meningitis,  likely viral. Patient has persistent leukocytosis but otherwise his lab work is reassuring. Chest x-ray shows no evidence of pneumonia. CT head is negative. On recheck, patient states he feels much better. Tachycardia has improved. I discussed at length with the patient the possibility of this being meningitis. My suspicion is that if this was meningitis it would be viral in nature given that patient is clinically well-appearing and no evidence of meningismus. We discussed the risk and benefits of lumbar puncture. Patient does not wish to pursue lumbar puncture at this time. Given clinical picture, patient will be started on doxycycline to cover for pneumonia that may not have shown up on x-ray and any possible tickborne illness. I instructed the patient he needs to follow-up closely with his primary physician. He was also given strict return precautions including worsening headache, development of neck stiffness or any new or worsening symptoms.  After history, exam, and medical workup I feel the patient has been appropriately medically screened and is safe for discharge home. Pertinent diagnoses were discussed with the patient. Patient was given return precautions.   Final Clinical Impressions(s) / ED Diagnoses   Final diagnoses:  Acute nonintractable headache, unspecified headache type  Fever, unspecified fever cause    New Prescriptions New Prescriptions   DOXYCYCLINE (VIBRAMYCIN) 100 MG CAPSULE    Take 1 capsule (100 mg total) by mouth 2 (two) times daily.     Merryl Hacker, MD 09/24/17 [phone]

## 2017-09-25 LAB — CULTURE, GROUP A STREP (THRC)

## 2017-10-05 DIAGNOSIS — R51 Headache: Secondary | ICD-10-CM | POA: Diagnosis not present

## 2017-10-05 DIAGNOSIS — Z1389 Encounter for screening for other disorder: Secondary | ICD-10-CM | POA: Diagnosis not present

## 2017-10-06 DIAGNOSIS — Z125 Encounter for screening for malignant neoplasm of prostate: Secondary | ICD-10-CM | POA: Diagnosis not present

## 2018-01-08 DIAGNOSIS — M545 Low back pain: Secondary | ICD-10-CM | POA: Diagnosis not present

## 2018-01-08 DIAGNOSIS — Z6823 Body mass index (BMI) 23.0-23.9, adult: Secondary | ICD-10-CM | POA: Diagnosis not present

## 2018-01-08 DIAGNOSIS — M5410 Radiculopathy, site unspecified: Secondary | ICD-10-CM | POA: Diagnosis not present

## 2018-03-13 ENCOUNTER — Emergency Department (HOSPITAL_COMMUNITY)
Admission: EM | Admit: 2018-03-13 | Discharge: 2018-03-13 | Disposition: A | Discharge status: Home / Self Care | Payer: 59 | Attending: Emergency Medicine | Admitting: Emergency Medicine

## 2018-03-13 ENCOUNTER — Encounter (HOSPITAL_COMMUNITY): Payer: Self-pay | Admitting: Emergency Medicine

## 2018-03-13 ENCOUNTER — Other Ambulatory Visit: Payer: Self-pay

## 2018-03-13 DIAGNOSIS — Z79899 Other long term (current) drug therapy: Secondary | ICD-10-CM | POA: Diagnosis not present

## 2018-03-13 DIAGNOSIS — F17210 Nicotine dependence, cigarettes, uncomplicated: Secondary | ICD-10-CM | POA: Insufficient documentation

## 2018-03-13 DIAGNOSIS — H9203 Otalgia, bilateral: Secondary | ICD-10-CM | POA: Diagnosis present

## 2018-03-13 DIAGNOSIS — I10 Essential (primary) hypertension: Secondary | ICD-10-CM | POA: Diagnosis not present

## 2018-03-13 DIAGNOSIS — Z85828 Personal history of other malignant neoplasm of skin: Secondary | ICD-10-CM | POA: Insufficient documentation

## 2018-03-13 DIAGNOSIS — H6593 Unspecified nonsuppurative otitis media, bilateral: Secondary | ICD-10-CM | POA: Insufficient documentation

## 2018-03-13 DIAGNOSIS — R0981 Nasal congestion: Secondary | ICD-10-CM | POA: Diagnosis not present

## 2018-03-13 DIAGNOSIS — Z7982 Long term (current) use of aspirin: Secondary | ICD-10-CM | POA: Insufficient documentation

## 2018-03-13 HISTORY — DX: Malignant (primary) neoplasm, unspecified: C80.1

## 2018-03-13 MED ORDER — AMOXICILLIN 500 MG PO CAPS: 500.0000 mg | ORAL_CAPSULE | Freq: Three times a day (TID) | ORAL | 0 refills | Status: AC

## 2018-03-13 MED ORDER — ANTIPYRINE-BENZOCAINE 5.4-1.4 % OT SOLN: 3.0000 [drp] | OTIC | 0 refills | Status: AC | PRN

## 2018-03-13 MED ORDER — AMOXICILLIN 250 MG PO CAPS
500.0000 mg | ORAL_CAPSULE | Freq: Once | ORAL | Status: AC
  Administered 2018-03-13: 500 mg via ORAL
  Filled 2018-03-13: qty 2

## 2018-03-13 MED ORDER — IBUPROFEN 800 MG PO TABS
800.0000 mg | ORAL_TABLET | Freq: Once | ORAL | Status: AC
  Administered 2018-03-13: 800 mg via ORAL
  Filled 2018-03-13: qty 1

## 2018-03-13 NOTE — ED Provider Notes (Signed)
[facility] Provider Note   CSN: [id number] Arrival date & time: 03/13/18  1231     History   Chief Complaint Chief Complaint  Patient presents with  . Otalgia    HPI Glen Livingston is a 57 y.o. male.  HPI   Glen Livingston is a 57 y.o. male who presents to the Emergency Department complaining of bilateral ear pain with right worse than left.  He describes a dull throbbing pain to both ears that began 2 days ago.  He also notes decreased hearing bilaterally and recent cough, nasal congestion, and runny nose.  He denies fever or chills.  No neck pain or stiffness, headache or dizziness.  He tried a previously prescribed eardrop without relief.   Past Medical History:  Diagnosis Date  . Back pain   . Cancer (HCC)    Skin  . High cholesterol   . Hypertension   . Kidney stone   . Sciatica     Patient Active Problem List   Diagnosis Date Noted  . History of colonic polyps   . CAP (community acquired pneumonia) 07/05/2012  . Hypokalemia 07/05/2012  . Hyponatremia 07/05/2012  . Nausea and vomiting 07/05/2012  . Benign hypertension 07/05/2012  . Tobacco abuse 07/05/2012  . Hyperlipidemia 07/05/2012  . History of nephrolithiasis 07/05/2012    Past Surgical History:  Procedure Laterality Date  . COLONOSCOPY N/A 12/27/2014   Procedure: COLONOSCOPY;  Surgeon: Daneil Dolin, MD;  Location: [facility];  Service: Endoscopy;  Laterality: N/A;  1045am  . SKIN CANCER EXCISION          Home Medications    Prior to Admission medications   Medication Sig Start Date End Date Taking? Authorizing Provider  amoxicillin (AMOXIL) 500 MG capsule Take 1 capsule (500 mg total) by mouth 3 (three) times daily. 03/13/18   Teleshia Lemere, PA-C  antipyrine-benzocaine Toniann Fail) OTIC solution Place 3-4 drops into both ears every 2 (two) hours as needed for ear pain. 03/13/18   Xzandria Clevinger, PA-C  aspirin 325 MG tablet Take 325 mg by mouth daily.    [provider]  doxycycline (VIBRAMYCIN) 100 MG capsule Take 1 capsule (100 mg total) by mouth 2 (two) times daily. 09/24/17   Horton, Barbette Hair, MD  ibuprofen (ADVIL,MOTRIN) 800 MG tablet Take 1 tablet (800 mg total) by mouth 3 (three) times daily. 09/22/17   Noemi Chapel, MD  lisinopril (PRINIVIL,ZESTRIL) 5 MG tablet Take 1 tablet by mouth 2 (two) times daily.  12/13/14   [provider]  simvastatin (ZOCOR) 20 MG tablet Take 1 tablet by mouth at bedtime.  11/16/14   [provider]    Family History No family history on file.  Social History Social History   Tobacco Use  . Smoking status: Current Every Day Smoker    Packs/day: 1.00    Types: Cigarettes  . Smokeless tobacco: Never Used  Substance Use Topics  . Alcohol use: No  . Drug use: No     Allergies   Bee venom   Review of Systems Review of Systems  Constitutional: Negative for activity change, appetite change, chills and fever.  HENT: Positive for congestion, ear pain, hearing loss and rhinorrhea. Negative for facial swelling, sore throat and trouble swallowing.   Eyes: Negative for visual disturbance.  Respiratory: Negative for cough, shortness of breath, wheezing and stridor.   Gastrointestinal: Negative for abdominal pain, nausea and vomiting.  Musculoskeletal: Negative for neck pain and neck  stiffness.  Skin: Negative for rash.  Neurological: Negative for dizziness, weakness, numbness and headaches.  Hematological: Negative for adenopathy.  Psychiatric/Behavioral: Negative for confusion.  All other systems reviewed and are negative.    Physical Exam Updated Vital Signs BP 123/77   Pulse 75   Temp 98.2 F (36.8 C) (Oral)   Resp 16   Ht 5\' 4"  (1.626 m)   Wt 78.9 kg (174 lb)   SpO2 99%   BMI 29.87 kg/m   Physical Exam  Constitutional: He is oriented to person, place, and time. He appears well-developed and well-nourished. No distress.  HENT:  Head: Normocephalic and atraumatic.    Mouth/Throat: Uvula is midline, oropharynx is clear and moist and mucous membranes are normal. No uvula swelling. No oropharyngeal exudate.  Mild erythema of the right TM.  There is a small amount of cerumen present.  Moderate erythema and loss of visual landmarks of the left TM.  No perforation.  No drainage.  Neck: Normal range of motion. Neck supple.  Cardiovascular: Normal rate, regular rhythm, normal heart sounds and intact distal pulses.  No murmur heard. Pulmonary/Chest: Effort normal and breath sounds normal. No stridor. No respiratory distress.  Lymphadenopathy:    He has no cervical adenopathy.  Neurological: He is alert and oriented to person, place, and time. Coordination normal.  Skin: Skin is warm and dry. No rash noted.  Nursing note and vitals reviewed.    ED Treatments / Results  Labs (all labs ordered are listed, but only abnormal results are displayed) Labs Reviewed - No data to display  EKG None  Radiology No results found.  Procedures Procedures (including critical care time)  Medications Ordered in ED Medications  amoxicillin (AMOXIL) capsule 500 mg (500 mg Oral Given 03/13/18 1344)  ibuprofen (ADVIL,MOTRIN) tablet 800 mg (800 mg Oral Given 03/13/18 1344)     Initial Impression / Assessment and Plan / ED Course  I have reviewed the triage vital signs and the nursing notes.  Pertinent labs & imaging results that were available during my care of the patient were reviewed by me and considered in my medical decision making (see chart for details).     Prescription for amoxicillin and Auralgan drops.  Patient agrees to Tylenol or ibuprofen if needed for pain.  He is otherwise well-appearing no focal neuro deficits.  Appears safe for discharge home agrees to treatment plan.  Final Clinical Impressions(s) / ED Diagnoses   Final diagnoses:  Bilateral non-suppurative otitis media    ED Discharge Orders        Ordered    amoxicillin (AMOXIL) 500 MG  capsule  3 times daily     03/13/18 1343    antipyrine-benzocaine (AURALGAN) OTIC solution  Every 2 hours PRN     03/13/18 1343       Kem Parkinson, PA-C 03/13/18 1624    Daleen Bo, MD 03/13/18 1731

## 2018-03-13 NOTE — ED Triage Notes (Signed)
Pt c/o bilateral otalgia, RT ear worse since Thursday. Pt recently with URI.

## 2018-03-13 NOTE — Discharge Instructions (Addendum)
Follow-up with your primary doctor for recheck if needed.  You may also take tylenol or ibuprofen as directed.

## 2018-06-21 DIAGNOSIS — G894 Chronic pain syndrome: Secondary | ICD-10-CM | POA: Diagnosis not present

## 2018-06-21 DIAGNOSIS — M5417 Radiculopathy, lumbosacral region: Secondary | ICD-10-CM | POA: Diagnosis not present

## 2018-06-21 DIAGNOSIS — Z6824 Body mass index (BMI) 24.0-24.9, adult: Secondary | ICD-10-CM | POA: Diagnosis not present

## 2018-06-21 DIAGNOSIS — Z720 Tobacco use: Secondary | ICD-10-CM | POA: Diagnosis not present

## 2018-06-21 DIAGNOSIS — Z7689 Persons encountering health services in other specified circumstances: Secondary | ICD-10-CM | POA: Diagnosis not present

## 2018-06-21 DIAGNOSIS — Z0001 Encounter for general adult medical examination with abnormal findings: Secondary | ICD-10-CM | POA: Diagnosis not present

## 2018-06-21 DIAGNOSIS — E782 Mixed hyperlipidemia: Secondary | ICD-10-CM | POA: Diagnosis not present

## 2018-07-07 ENCOUNTER — Other Ambulatory Visit (HOSPITAL_COMMUNITY): Payer: Self-pay | Admitting: Internal Medicine

## 2018-07-07 DIAGNOSIS — M5410 Radiculopathy, site unspecified: Secondary | ICD-10-CM

## 2018-07-19 ENCOUNTER — Ambulatory Visit (HOSPITAL_COMMUNITY)
Admission: RE | Admit: 2018-07-19 | Discharge: 2018-07-19 | Disposition: A | Discharge status: Home / Self Care | Payer: 59 | Source: Ambulatory Visit | Attending: Internal Medicine | Admitting: Internal Medicine

## 2018-07-19 DIAGNOSIS — M5410 Radiculopathy, site unspecified: Secondary | ICD-10-CM

## 2018-07-19 DIAGNOSIS — M545 Low back pain: Secondary | ICD-10-CM | POA: Diagnosis not present

## 2018-07-19 DIAGNOSIS — M48061 Spinal stenosis, lumbar region without neurogenic claudication: Secondary | ICD-10-CM | POA: Diagnosis not present

## 2018-07-19 DIAGNOSIS — M47816 Spondylosis without myelopathy or radiculopathy, lumbar region: Secondary | ICD-10-CM | POA: Insufficient documentation

## 2018-08-11 DIAGNOSIS — M4316 Spondylolisthesis, lumbar region: Secondary | ICD-10-CM | POA: Diagnosis not present

## 2018-08-11 DIAGNOSIS — M48062 Spinal stenosis, lumbar region with neurogenic claudication: Secondary | ICD-10-CM | POA: Diagnosis not present

## 2018-08-11 DIAGNOSIS — I10 Essential (primary) hypertension: Secondary | ICD-10-CM | POA: Diagnosis not present

## 2018-08-25 ENCOUNTER — Other Ambulatory Visit: Payer: Self-pay | Admitting: Neurosurgery

## 2018-08-25 DIAGNOSIS — M48062 Spinal stenosis, lumbar region with neurogenic claudication: Secondary | ICD-10-CM

## 2018-09-08 ENCOUNTER — Ambulatory Visit
Admission: RE | Admit: 2018-09-08 | Discharge: 2018-09-08 | Disposition: A | Discharge status: Home / Self Care | Payer: 59 | Source: Ambulatory Visit | Attending: Neurosurgery | Admitting: Neurosurgery

## 2018-09-08 DIAGNOSIS — M48062 Spinal stenosis, lumbar region with neurogenic claudication: Secondary | ICD-10-CM

## 2018-09-08 DIAGNOSIS — M48061 Spinal stenosis, lumbar region without neurogenic claudication: Secondary | ICD-10-CM | POA: Diagnosis not present

## 2018-09-08 MED ORDER — IOPAMIDOL (ISOVUE-M 200) INJECTION 41%
1.0000 mL | Freq: Once | INTRAMUSCULAR | Status: AC
  Administered 2018-09-08: 1 mL via EPIDURAL

## 2018-09-08 MED ORDER — METHYLPREDNISOLONE ACETATE 40 MG/ML INJ SUSP (RADIOLOG
120.0000 mg | Freq: Once | INTRAMUSCULAR | Status: AC
  Administered 2018-09-08: 120 mg via EPIDURAL

## 2018-09-08 NOTE — Discharge Instructions (Signed)

## 2021-12-31 ENCOUNTER — Encounter: Payer: Self-pay | Admitting: *Deleted

## 2023-01-12 ENCOUNTER — Encounter (HOSPITAL_COMMUNITY): Payer: Self-pay | Admitting: Emergency Medicine

## 2023-01-12 ENCOUNTER — Emergency Department (HOSPITAL_COMMUNITY)
Admission: EM | Admit: 2023-01-12 | Discharge: 2023-01-12 | Disposition: A | Discharge status: Home / Self Care | Payer: 59 | Attending: Emergency Medicine | Admitting: Emergency Medicine

## 2023-01-12 ENCOUNTER — Other Ambulatory Visit: Payer: Self-pay

## 2023-01-12 DIAGNOSIS — Z7982 Long term (current) use of aspirin: Secondary | ICD-10-CM | POA: Insufficient documentation

## 2023-01-12 DIAGNOSIS — U071 COVID-19: Secondary | ICD-10-CM | POA: Diagnosis not present

## 2023-01-12 DIAGNOSIS — R519 Headache, unspecified: Secondary | ICD-10-CM | POA: Diagnosis present

## 2023-01-12 LAB — RESP PANEL BY RT-PCR (RSV, FLU A&B, COVID)  RVPGX2
Influenza A by PCR: NEGATIVE
Influenza B by PCR: NEGATIVE
Resp Syncytial Virus by PCR: NEGATIVE
SARS Coronavirus 2 by RT PCR: POSITIVE — AB

## 2023-01-12 MED ORDER — ONDANSETRON 4 MG PO TBDP
4.0000 mg | ORAL_TABLET | Freq: Once | ORAL | Status: AC
  Administered 2023-01-12: 4 mg via ORAL
  Filled 2023-01-12: qty 1

## 2023-01-12 MED ORDER — ACETAMINOPHEN 500 MG PO TABS
1000.0000 mg | ORAL_TABLET | Freq: Once | ORAL | Status: AC
  Administered 2023-01-12: 1000 mg via ORAL
  Filled 2023-01-12: qty 2

## 2023-01-12 MED ORDER — KETOROLAC TROMETHAMINE 15 MG/ML IJ SOLN
15.0000 mg | Freq: Once | INTRAMUSCULAR | Status: AC
  Administered 2023-01-12: 15 mg via INTRAMUSCULAR
  Filled 2023-01-12: qty 1

## 2023-01-12 NOTE — ED Triage Notes (Signed)
Headache x 2 days. Started vomiting x 2 today. Mm wet. Son lives with him and tested positive for covid. Denies difficulty swallowing/speech and denies weakness. C/o nothing tasting right. Nad in triage

## 2023-01-12 NOTE — Discharge Instructions (Addendum)
You came to the department today due to flulike symptoms.  You tested positive for covid.  This sort of viral illness is something that can be treated with over-the-counter medications like Tylenol and ibuprofen.  Other options include DayQuil/NyQuil, Mucinex for congestion, Robitussin/Delsym for cough and any other over-the-counter medications.  It is very important that you stay hydrated during this time as well.  You may use things like Powerade, Gatorade, electrolyte powders and water.  We hope that you feel better and do not hesitate to return to the emergency department with any worsening symptoms, especially chest pain, shortness of breath, dizziness and loss of consciousness.   Your work note is attached

## 2023-01-12 NOTE — ED Provider Notes (Signed)
[facility] Provider Note   CSN: [id number] Arrival date & time: 01/12/23  1156     History  Chief Complaint  Patient presents with   Headache   Vomiting    DMARCO BALDUS is a 62 y.o. male presenting with viral symptoms since Saturday.  He tells me that his son tested positive for COVID yesterday.  Notes a bad headache and nausea and vomiting that started this morning.  No chest pain, shortness of breath but does endorse a cough that is nonproductive.   Headache      Home Medications Prior to Admission medications   Medication Sig Start Date End Date Taking? Authorizing Provider  amoxicillin (AMOXIL) 500 MG capsule Take 1 capsule (500 mg total) by mouth 3 (three) times daily. 03/13/18   Triplett, Tammy, PA-C  antipyrine-benzocaine Toniann Fail) OTIC solution Place 3-4 drops into both ears every 2 (two) hours as needed for ear pain. 03/13/18   Triplett, Tammy, PA-C  aspirin 325 MG tablet Take 325 mg by mouth daily.    [provider]  doxycycline (VIBRAMYCIN) 100 MG capsule Take 1 capsule (100 mg total) by mouth 2 (two) times daily. 09/24/17   Horton, Barbette Hair, MD  ibuprofen (ADVIL,MOTRIN) 800 MG tablet Take 1 tablet (800 mg total) by mouth 3 (three) times daily. 09/22/17   Noemi Chapel, MD  lisinopril (PRINIVIL,ZESTRIL) 5 MG tablet Take 1 tablet by mouth 2 (two) times daily.  12/13/14   [provider]  simvastatin (ZOCOR) 20 MG tablet Take 1 tablet by mouth at bedtime.  11/16/14   [provider]      Allergies    Bee venom    Review of Systems   Review of Systems  Neurological:  Positive for headaches.    Physical Exam Updated Vital Signs BP (!) 169/109 (BP Location: Right Arm)   Pulse 98   Temp 98.7 F (37.1 C) (Oral)   Resp (!) 22   SpO2 96%  Physical Exam Vitals and nursing note reviewed.  Constitutional:      General: He is not in acute distress.    Appearance: Normal appearance. He is not  ill-appearing.  HENT:     Head: Normocephalic and atraumatic.  Eyes:     General: No scleral icterus.    Conjunctiva/sclera: Conjunctivae normal.  Cardiovascular:     Rate and Rhythm: Normal rate and regular rhythm.  Pulmonary:     Effort: Pulmonary effort is normal. No respiratory distress.     Breath sounds: No wheezing.  Abdominal:     Palpations: Abdomen is soft.  Skin:    General: Skin is warm and dry.     Findings: No rash.  Neurological:     Mental Status: He is alert.  Psychiatric:        Mood and Affect: Mood normal.     ED Results / Procedures / Treatments   Labs (all labs ordered are listed, but only abnormal results are displayed) Labs Reviewed  RESP PANEL BY RT-PCR (RSV, FLU A&B, COVID)  RVPGX2 - Abnormal; Notable for the following components:      Result Value   SARS Coronavirus 2 by RT PCR POSITIVE (*)    All other components within normal limits    EKG None  Radiology No results found.  Procedures Procedures   Medications Ordered in ED Medications  ondansetron (ZOFRAN-ODT) disintegrating tablet 4 mg (4 mg Oral Given 01/12/23 1409)  acetaminophen (TYLENOL) tablet 1,000 mg (  1,000 mg Oral Given 01/12/23 1437)  ketorolac (TORADOL) 15 MG/ML injection 15 mg (15 mg Intramuscular Given 01/12/23 1508)    ED Course/ Medical Decision Making/ A&P                             Medical Decision Making Risk OTC drugs. Prescription drug management.   62 year old male presenting today with URI symptoms.  Symptoms have been going on for 3 days.  On physical exam they are well-appearing.  Tested positive for COVID. We discussed that COVID is something that needs to run its course and they may use ibuprofen, Tylenol, DayQuil/NyQuil and other over-the-counter medications for their symptoms.  We also did speak about Paxlovid but patient agrees that this is not needed at this time.  Return precautions discussed and they understand that they may follow-up on their  symptoms outpatient with either PCP or urgent care as needed for nonemergent symptoms.  Agreeable to discharge at this time.    Final Clinical Impression(s) / ED Diagnoses Final diagnoses:  COVID-19    Rx / DC Orders ED Discharge Orders     None      Results and diagnoses were explained to the patient. Return precautions discussed in full. Patient had no additional questions and expressed complete understanding.   This chart was dictated using voice recognition software.  Despite best efforts to proofread,  errors can occur which can change the documentation meaning.    Darliss Ridgel 01/12/23 1514    Milton Ferguson, MD 01/13/23 1121

## 2024-12-16 ENCOUNTER — Ambulatory Visit: Payer: Self-pay | Admitting: Family Medicine

## 2025-03-24 ENCOUNTER — Ambulatory Visit
